# Patient Record
Sex: Male | Born: 1965 | Race: White | Hispanic: No | Smoking: Former smoker
Health system: Southern US, Community
[De-identification: ages and names within clinical notes are randomized; demographics above are authoritative.]

## PROBLEM LIST (undated history)

## (undated) DIAGNOSIS — G459 Transient cerebral ischemic attack, unspecified: Secondary | ICD-10-CM

## (undated) DIAGNOSIS — F1490 Cocaine use, unspecified, uncomplicated: Secondary | ICD-10-CM

## (undated) DIAGNOSIS — F319 Bipolar disorder, unspecified: Secondary | ICD-10-CM

## (undated) DIAGNOSIS — F411 Generalized anxiety disorder: Secondary | ICD-10-CM

## (undated) HISTORY — DX: Cocaine use, unspecified, uncomplicated: F14.90

## (undated) HISTORY — PX: BACK SURGERY: SHX140

## (undated) HISTORY — PX: OTHER SURGICAL HISTORY: SHX169

## (undated) HISTORY — DX: Generalized anxiety disorder: F41.1

## (undated) HISTORY — PX: KNEE SURGERY: SHX244

---

## 1997-09-20 HISTORY — PX: LUMBAR DISC SURGERY: SHX700

## 2005-10-15 ENCOUNTER — Ambulatory Visit: Payer: Self-pay | Admitting: Gastroenterology

## 2006-06-30 ENCOUNTER — Ambulatory Visit: Payer: Self-pay | Admitting: Family Medicine

## 2006-12-21 ENCOUNTER — Other Ambulatory Visit (HOSPITAL_COMMUNITY): Admission: RE | Admit: 2006-12-21 | Discharge: 2007-01-06 | Payer: Self-pay | Admitting: Psychiatry

## 2006-12-23 ENCOUNTER — Ambulatory Visit: Payer: Self-pay | Admitting: Psychiatry

## 2009-09-20 HISTORY — PX: OTHER SURGICAL HISTORY: SHX169

## 2009-11-12 ENCOUNTER — Encounter: Admission: RE | Admit: 2009-11-12 | Discharge: 2009-11-12 | Payer: Self-pay | Admitting: Family Medicine

## 2009-11-14 ENCOUNTER — Ambulatory Visit (HOSPITAL_BASED_OUTPATIENT_CLINIC_OR_DEPARTMENT_OTHER): Admission: RE | Admit: 2009-11-14 | Discharge: 2009-11-14 | Payer: Self-pay | Admitting: Orthopedic Surgery

## 2011-05-22 HISTORY — PX: OTHER SURGICAL HISTORY: SHX169

## 2011-06-01 ENCOUNTER — Ambulatory Visit (HOSPITAL_BASED_OUTPATIENT_CLINIC_OR_DEPARTMENT_OTHER)
Admission: RE | Admit: 2011-06-01 | Discharge: 2011-06-01 | Disposition: A | Discharge status: Home / Self Care | Payer: Worker's Compensation | Source: Ambulatory Visit | Attending: Orthopedic Surgery | Admitting: Orthopedic Surgery

## 2011-06-01 DIAGNOSIS — Z01812 Encounter for preprocedural laboratory examination: Secondary | ICD-10-CM | POA: Insufficient documentation

## 2011-06-01 DIAGNOSIS — M249 Joint derangement, unspecified: Secondary | ICD-10-CM | POA: Insufficient documentation

## 2011-06-08 NOTE — Op Note (Signed)
NAME:  Craig [name], Craig [name] NO.:  [id number]  MEDICAL RECORD NO.:  [id number]  LOCATION:                                 FACILITY:  PHYSICIAN:  Jones Broom, MD         DATE OF BIRTH:  DATE OF PROCEDURE:  06/01/2011 DATE OF DISCHARGE:                              OPERATIVE REPORT   PREOPERATIVE DIAGNOSIS:  Left high-grade distal biceps partial tear.  POSTOPERATIVE DIAGNOSIS:  Left high-grade distal biceps partial tear.  PROCEDURE PERFORMED:  Takedown and reinsertion of high-grade left distal biceps tear.  ATTENDING SURGEON:  Jones Broom, MD  ASSISTANT:  Laural Benes. Su Hilt, [place]  ANESTHESIA:  GETA with preoperative interscalene block.  COMPLICATIONS:  None.  DRAINS:  None.  SPECIMENS:  None.  ESTIMATED BLOOD LOSS:  Minimal.  TOURNIQUET TIME:  None.  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  He had an injury approximately 9 months ago to the distal biceps on the left side. He was diagnosed with a partial tear and has gone through an extensive nonoperative management with activity modification therapy and anti- inflammatories.  He has gone to have continued symptoms despite conservative management and wished to go ahead with surgical management. His MRI showed partial tearing with tendinosis and he was indicated for a takedown and repair of the distal biceps.  He understood the risks, benefits, alternatives of the procedure including but not limited to risk of bleeding, infection, damage to neurovascular structures, risk of non-healing and potential for heterotopic bone formation.  He understood all this and wanted to go ahead with surgery.  OPERATIVE FINDINGS:  The tendon distally was at least 50% torn from the bicipital tuberosity on the proximal radius.  It was in continuity but after exploration I could clearly palpate a significant portion of the tuberosity  which was denuded completely of tendon.  I completed the tear, mobilizing the tendon, and debrided the distal tendon edge of healthy nonviable appearing tendon and subsequently repaired it with a Biomet ZipLoop device and bringing the tendon nicely down into an 8-mm reamed intramedullary hole.  PROCEDURE:  The patient was identified in the preoperative holding area where I personally marked the operative site after verifying site, side and procedure with the patient.  He was taken back to the operating room after an interscalene block was given by the attending anesthesiologist. He was placed supine and general anesthesia was induced.  The left upper extremity was then prepped and draped in a standard sterile fashion with a nonsterile tourniquet applied but not elevated throughout the procedure.  He did receive IV antibiotics prior to the procedure.  After the appropriate time-out procedure, an approximately 5-cm incision was made obliquely from proximal ulnar to distal radial over the bicipital tuberosity on proximal radius.  Dissection was carried down through subcutaneous tissues gently retracting venous structures away until the long head of the biceps tendon was identified.  Laterally, the antebrachial cutaneous nerve was identified just lateral to the tendon and carefully protected throughout the procedure.  One crossing vein had been ligated for exposure and ligated  with 3-0 Vicryl ties.  Using meticulous dissection, the pathway down to the bicipital tuberosity was explored.  The distal biceps tendon was intact although it did not move significantly with pronation and supination of the forearm.  After exposure distally, I was easily able to bring my finger along the bicipital tuberosity and there was a significant portion of it which was bare of tendon.  With gentle traction, I was unable to complete the tear but using Metzenbaum scissors under direct visualization, I was able  to complete the tear.  The proximal rays was exposed carefully using right angle retractors only on the radial aspect to protect the posterior interosseous nerve and carefully debrided of excess tendon and scar tissue.  The tendon was then prepared using two #2 FiberWire type stitches in a locking  Krackow configuration going through the loop device after debriding the unhealthy appearing tendon from the remaining distal biceps tendon.  It was bulletized and felt to be appropriate for an 8-mm tunnel.  The proximal radius was then again exposed and the guide pin was placed bicortically and its position was verified on fluoroscopic imaging to be appropriately positioned.  An 8-mm reamer was then used to ream one cortex and a 4.5-mm reamer was used on the dorsal cortex.  Copious irrigation was used and all bone dust was removed.  The small beef pin was then advanced through the dorsal tissues carefully and a small dorsal incision was made to retrieve the beef pin and pass the leading sutures from the device.  Under direct visualization, the metallic button implant was then passed through both reamed tunnels and slipped on the dorsal cortex.  Its position was verified on fluoroscopy. Then, under direct visualization, the sutures were pulled and the tendon was brought down into the prepared hole with slight flexion of the elbow to allow less tension.  This was done under direct visualization and the tendon was visualized going into the intramedullary area.  The sutures were then cut, tied, and the dorsal suture was removed.  Fluoroscopic imaging verified appropriate positioning of the implant which was right down on the bone dorsally.  The wound was then copiously irrigated with normal saline, subsequently closed in layers with 3-0 Vicryl in deep dermal layer and 4-0 Monocryl for skin closure with Steri-Strips. Sterile dressings were then applied including 4x4s, sterile Webril, and a  well-padded, well molded posterior splint at 90 degrees in neutral position.  The patient was then allowed to awaken from general anesthesia, transferred to stretcher and taken to recovery room in stable condition.  POSTOPERATIVE PLAN:  He will be discharged home in stable condition with his family today.  He will be discharged on Percocet for pain control and Indocin SR for heterotopic prophylaxis.     Jones Broom, MD     JC/MEDQ  D:  06/01/2011  T:  06/01/2011  Job:  [id number]  Electronically Signed by Jones Broom  on 06/08/2011 11:34:37 PM

## 2013-10-23 DIAGNOSIS — R0989 Other specified symptoms and signs involving the circulatory and respiratory systems: Secondary | ICD-10-CM | POA: Insufficient documentation

## 2013-10-23 DIAGNOSIS — R5383 Other fatigue: Secondary | ICD-10-CM | POA: Insufficient documentation

## 2013-10-23 DIAGNOSIS — R531 Weakness: Secondary | ICD-10-CM | POA: Insufficient documentation

## 2013-10-23 DIAGNOSIS — R079 Chest pain, unspecified: Secondary | ICD-10-CM | POA: Insufficient documentation

## 2013-10-25 ENCOUNTER — Encounter: Payer: Self-pay | Admitting: Cardiovascular Disease

## 2013-10-25 ENCOUNTER — Ambulatory Visit (INDEPENDENT_AMBULATORY_CARE_PROVIDER_SITE_OTHER): Payer: 59 | Admitting: Cardiovascular Disease

## 2013-10-25 ENCOUNTER — Encounter (INDEPENDENT_AMBULATORY_CARE_PROVIDER_SITE_OTHER): Payer: Self-pay

## 2013-10-25 ENCOUNTER — Telehealth: Payer: Self-pay | Admitting: *Deleted

## 2013-10-25 ENCOUNTER — Encounter (HOSPITAL_COMMUNITY): Payer: Self-pay

## 2013-10-25 VITALS — BP 106/80 | HR 82 | Ht 71.0 in | Wt 175.0 lb

## 2013-10-25 DIAGNOSIS — R5383 Other fatigue: Secondary | ICD-10-CM

## 2013-10-25 DIAGNOSIS — R079 Chest pain, unspecified: Secondary | ICD-10-CM

## 2013-10-25 DIAGNOSIS — R531 Weakness: Secondary | ICD-10-CM

## 2013-10-25 DIAGNOSIS — R5381 Other malaise: Secondary | ICD-10-CM

## 2013-10-25 MED ORDER — ASPIRIN EC 81 MG PO TBEC: 81.0000 mg | DELAYED_RELEASE_TABLET | Freq: Every day | ORAL | Status: DC

## 2013-10-25 NOTE — Telephone Encounter (Signed)
Message copied by Jonathon Jordan on Thu Oct 25, 2013  4:00 PM ------      Message from: Thayer Headings      Created: Thu Oct 25, 2013  3:57 PM      Regarding: RE: Exercise Myoview       We will schedule him for a GXT and an echo.      i have copied Jodette on this message.              ----- Message -----         From: Lillia Pauls         Sent: 10/25/2013   1:20 PM           To: Thayer Headings, MD      Subject: Exercise Myoview                                         Dr. Acie Fredrickson,            Case went into clinical review.  Test is Monday.  If not approved do you want to do MD review?  Or do 2D echo and GXT?              They did ask if pt can walk on treadmill.  (RBBB)            Thanks            Charmaine       ------

## 2013-10-25 NOTE — Telephone Encounter (Signed)
PT WAS INFORMED/ NEW ORDERS PLACED.

## 2013-10-25 NOTE — Patient Instructions (Signed)
Your physician has requested that you have en exercise stress Myoview ASAP PLEASE. For further information please visit [email or web address]. Please follow instruction sheet, as given.   Your physician recommends that you schedule a follow-up appointment in: 2-3 months    Your physician has recommended you make the following change in your medication:  START ASPIRIN 81 MG EACH EVENING, USE ENTERIC COATED ASPIRIN

## 2013-10-25 NOTE — Progress Notes (Signed)
Craig [name] Date of Birth  [birth year]       [facility]    [employer] [address] [place], [place], [place]  [address]   [place], [place]  [address] [address]   Fax  [phone]    Fax [phone]  Problem List: 1. Chest pain   History of Present Illness:  Craig [name] is a 48 yo who is referred for further eval of some chest pain.  Several weeks ago, Craig [name] had some SSCP ( just after lunch).    The pain lasted 5 minutes.  Craig [name] took an ASA.  Craig [name] felt poorly the rest of the day and for several days following that.   Craig [name] was fatigued for several days .  Craig [name] did not return to work for several days.   Craig [name] slept quite a bit for 2 days following that.    STress myoview 01/05/12 was negative for ischemia.  Craig [name] has had some chest pressure - at rest (while driving).    Craig [name] does not get any regular exercise.  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    Craig [name] smokes 1 ppd.   Craig [name] used cocaine for 21 years - none in 11 years.   Craig [name] has lost 30 lbs over the past 6 months.  Grandfather had CAD Father had CAD in Craig [name] 81's.  Current Outpatient Prescriptions on File Prior to Visit  Medication Sig Dispense Refill  . ALPRAZolam (XANAX) 1 MG tablet Take 1 mg by mouth at bedtime as needed for anxiety.      Marland Kitchen zolpidem (AMBIEN CR) 12.5 MG CR tablet Take 12.5 mg by mouth at bedtime as needed for sleep.       No current facility-administered medications on file prior to visit.    Allergies  Allergen Reactions  . Celexa [Citalopram Hydrobromide]   . Effexor [Venlafaxine]   . Lexapro [Escitalopram Oxalate]     AGITATED   . Paxil [Paroxetine Hcl]     AGITATED   . Zoloft [Sertraline Hcl]     AGITATED    Past Medical History  Diagnosis Date  . GAD (generalized anxiety disorder)   . Cocaine use     QUIT IN 2004    Past Surgical History  Procedure Laterality Date  . Lumbar disc surgery  1999  . Right hand fracture  2011  . Left  biceps tear  05/2011    History  Smoking status  . Current Every Day Smoker  Smokeless tobacco  . Not on file    History  Alcohol Use: Not on file    Family History  Problem Relation Age of Onset  . CAD Father     Reviw of Systems:  Reviewed in the HPI.  All other systems are negative.  Physical Exam: Blood pressure 106/80, pulse 82, height 5\' 11"  (1.803 m), weight 175 lb (79.379 kg). Wt Readings from Last 3 Encounters:  10/25/13 175 lb (79.379 kg)     General: Well developed, well nourished, in no acute distress.  Head: Normocephalic, atraumatic, sclera non-icteric, mucus membranes are moist,   Neck: Supple. Carotids are 2 + without bruits. No JVD   Lungs: Clear   Heart: RR, normal S1, S2.   Abdomen: Soft, non-tender, non-distended with normal bowel sounds.  Msk:  Strength and tone are normal   Extremities: No clubbing or cyanosis. No edema.  Distal pedal pulses are 2+ and equal  Neuro: CN II - XII intact.  Alert and oriented X 3.   Psych:  Normal   ECG: Feb. 5, 2015:  NSR at 82, inc. RBBB.  No ST or T wave changes  Assessment / Plan:

## 2013-10-25 NOTE — Assessment & Plan Note (Addendum)
He presents today for further evaluation of some chest pain. He said several episodes of chest discomfort that occurred at rest. He's not had any exertional chest discomfort but he doesn't get any regular aerobic exercise. He has a family history of premature coronary artery disease and also has a long history of cigarette smoking.  We'll schedule him for a stress Myoview study next week. I strongly recommended that he stop smoking. We'll start him on an aspirin 81 mg a day.  He reported that he's lost 30 pounds over the past 6 months and has not been on a diet. He did discuss this with Dr. Alroy Dust. This certainly will need further evaluation.  His dad is relatively unhealthy. He eats a moderate amount fast food and does not eat any vegetables.   I have encouraged him to improve his diet.   I will see him in 2-3 months - sooner if his myoview is positive.  Addendum:  His insurance company did not approve the stress myoview.  We will do a GXT and an echo.  I will see him back for follow up.

## 2013-10-26 ENCOUNTER — Telehealth: Payer: Self-pay | Admitting: Cardiovascular Disease

## 2013-10-26 ENCOUNTER — Telehealth: Payer: Self-pay | Admitting: *Deleted

## 2013-10-26 NOTE — Telephone Encounter (Signed)
I was contacted by Charmaine who relayed the info that insurance would not permit..  She may know from experience that they would not authorize but if the insurance will auth, lets do the stress nuclear

## 2013-10-26 NOTE — Telephone Encounter (Signed)
Pt states he called his insurance yesterday and they stated that we never contacted them or submitted any testing requests from our office. I spoke with Charmaine Hall/ billing, request is on pending status still // orders and OV was printed and given to Nurse manager Emeline Darling RN. She will review and call the pt back.

## 2013-10-26 NOTE — Telephone Encounter (Signed)
New problem   Pt need to speak to nurse concerning his Echo procedure being cancelled. Please call pt

## 2013-10-26 NOTE — Telephone Encounter (Signed)
I spoke with Craig [name] on yesterday to schedule his echo and gxt both were schedule for 2/11 and 11/01/13,patient request they both be schedule on the same  day I inform him this could push the test out until the end of the month or the first of the next month and he was okay with this.I call Craig [name] today and he said his  Insurance company was not notified and would like for [place] to resubmit the test again.Patient refused,test cancelled.Waiting to here back from [employer].

## 2013-10-29 ENCOUNTER — Encounter (HOSPITAL_COMMUNITY): Payer: Self-pay

## 2013-10-31 ENCOUNTER — Encounter (HOSPITAL_COMMUNITY): Payer: Self-pay

## 2013-10-31 ENCOUNTER — Ambulatory Visit (HOSPITAL_COMMUNITY)
Admission: RE | Admit: 2013-10-31 | Discharge: 2013-10-31 | Disposition: A | Discharge status: Home / Self Care | Payer: 59 | Source: Ambulatory Visit | Attending: Cardiovascular Disease | Admitting: Cardiovascular Disease

## 2013-10-31 DIAGNOSIS — R079 Chest pain, unspecified: Secondary | ICD-10-CM | POA: Insufficient documentation

## 2013-10-31 MED ORDER — TECHNETIUM TC 99M SESTAMIBI GENERIC - CARDIOLITE
30.0000 | Freq: Once | INTRAVENOUS | Status: AC | PRN
  Administered 2013-10-31: 30 via INTRAVENOUS

## 2013-11-01 ENCOUNTER — Ambulatory Visit (HOSPITAL_COMMUNITY): Payer: Self-pay

## 2013-11-06 ENCOUNTER — Ambulatory Visit (HOSPITAL_COMMUNITY): Payer: 59

## 2013-11-14 ENCOUNTER — Ambulatory Visit (HOSPITAL_COMMUNITY): Payer: 59

## 2013-11-20 ENCOUNTER — Ambulatory Visit (HOSPITAL_COMMUNITY)
Admission: RE | Admit: 2013-11-20 | Discharge: 2013-11-20 | Disposition: A | Discharge status: Home / Self Care | Payer: 59 | Source: Ambulatory Visit | Attending: Cardiovascular Disease | Admitting: Cardiovascular Disease

## 2013-11-20 DIAGNOSIS — I369 Nonrheumatic tricuspid valve disorder, unspecified: Secondary | ICD-10-CM

## 2013-11-20 DIAGNOSIS — R079 Chest pain, unspecified: Secondary | ICD-10-CM | POA: Insufficient documentation

## 2013-11-20 NOTE — Progress Notes (Signed)
2D Echo Performed 11/20/2013    Taquila Leys, RCS  

## 2014-02-14 ENCOUNTER — Ambulatory Visit: Payer: 59 | Admitting: Cardiovascular Disease

## 2014-06-10 ENCOUNTER — Ambulatory Visit (HOSPITAL_COMMUNITY)
Admission: RE | Admit: 2014-06-10 | Discharge: 2014-06-10 | Disposition: A | Discharge status: Home / Self Care | Payer: 59 | Source: Ambulatory Visit | Attending: Orthopedic Surgery | Admitting: Orthopedic Surgery

## 2014-06-10 ENCOUNTER — Other Ambulatory Visit (HOSPITAL_COMMUNITY): Payer: Self-pay | Admitting: Orthopedic Surgery

## 2014-06-10 DIAGNOSIS — M79605 Pain in left leg: Secondary | ICD-10-CM

## 2014-06-10 DIAGNOSIS — M79609 Pain in unspecified limb: Secondary | ICD-10-CM | POA: Insufficient documentation

## 2014-06-10 DIAGNOSIS — M7989 Other specified soft tissue disorders: Secondary | ICD-10-CM | POA: Insufficient documentation

## 2014-06-10 NOTE — Progress Notes (Signed)
*  PRELIMINARY RESULTS* Vascular Ultrasound Left lower extremity venous duplex has been completed.  Preliminary findings: No evidence of DVT.  Called results to Angie at Dr. Bettina Gavia office. A PA will contact patient with further instructions.    Landry Mellow, RDMS, RVT  06/10/2014, 4:41 PM

## 2014-07-16 ENCOUNTER — Encounter (HOSPITAL_COMMUNITY): Payer: Self-pay | Admitting: Emergency Medicine

## 2014-07-16 ENCOUNTER — Emergency Department (HOSPITAL_COMMUNITY)
Admission: EM | Admit: 2014-07-16 | Discharge: 2014-07-16 | Disposition: A | Discharge status: Home / Self Care | Payer: 59 | Source: Home / Self Care | Attending: Family Medicine | Admitting: Family Medicine

## 2014-07-16 DIAGNOSIS — S61319A Laceration without foreign body of unspecified finger with damage to nail, initial encounter: Secondary | ICD-10-CM

## 2014-07-16 MED ORDER — TETANUS-DIPHTH-ACELL PERTUSSIS 5-2.5-18.5 LF-MCG/0.5 IM SUSP
0.5000 mL | Freq: Once | INTRAMUSCULAR | Status: AC
  Administered 2014-07-16: 0.5 mL via INTRAMUSCULAR

## 2014-07-16 MED ORDER — TETANUS-DIPHTH-ACELL PERTUSSIS 5-2.5-18.5 LF-MCG/0.5 IM SUSP
INTRAMUSCULAR | Status: AC
  Filled 2014-07-16: qty 0.5

## 2014-07-16 NOTE — Discharge Instructions (Signed)
Leave bandaged until fri then cover and clean as discussed, you had a tetanus booster. Return as needed.

## 2014-07-16 NOTE — ED Notes (Signed)
Patient c/o left index finger laceration onset this morning. Patient reports he was cutting wallpaper and cut his finger with a razor blade. Patient is unsure of when last tetanus shot was. Patient is in NAD.

## 2014-07-16 NOTE — ED Provider Notes (Signed)
CSN: [id number]     Arrival date & time 07/16/14  1634 History   First MD Initiated Contact with Patient 07/16/14 1700     Chief Complaint  Patient presents with  . Extremity Laceration   (Consider location/radiation/quality/duration/timing/severity/associated sxs/prior Treatment) Patient is a 48 y.o. male presenting with skin laceration.  Laceration Location:  Finger Finger laceration location:  L index finger Depth:  Cutaneous Quality: avulsion   Bleeding: controlled   Time since incident:  6 hours Laceration mechanism:  Knife (cutting wallpaper with new razor blade and cut fingertip.) Pain details:    Severity:  Mild   Progression:  Unchanged Foreign body present:  No foreign bodies Worsened by:  Nothing tried Ineffective treatments:  None tried Tetanus status:  Out of date   Past Medical History  Diagnosis Date  . GAD (generalized anxiety disorder)   . Cocaine use     QUIT IN 2004   Past Surgical History  Procedure Laterality Date  . Lumbar disc surgery  1999  . Right hand fracture  2011  . Left biceps tear  05/2011   Family History  Problem Relation Age of Onset  . CAD Father    History  Substance Use Topics  . Smoking status: Current Every Day Smoker  . Smokeless tobacco: Not on file  . Alcohol Use: Not on file    Review of Systems  Constitutional: Negative.   Skin: Positive for wound.    Allergies  Celexa; Effexor; Lexapro; Paxil; and Zoloft  Home Medications   Prior to Admission medications   Medication Sig Start Date End Date Taking? Authorizing Provider  ALPRAZolam Duanne Moron) 1 MG tablet Take 1 mg by mouth at bedtime as needed for anxiety.    Historical Provider, MD  aspirin EC 81 MG tablet Take 1 tablet (81 mg total) by mouth daily. 10/25/13   Thayer Headings, MD  zolpidem (AMBIEN CR) 12.5 MG CR tablet Take 12.5 mg by mouth at bedtime as needed for sleep.    Historical Provider, MD   BP 143/97  Pulse 62  Temp(Src) 98.3 F (36.8 C) (Oral)   Resp 18  SpO2 98% Physical Exam  Nursing note and vitals reviewed. Constitutional: He is oriented to person, place, and time. He appears well-developed and well-nourished.  Musculoskeletal:       Hands: Neurological: He is alert and oriented to person, place, and time.  Skin: Skin is warm and dry.    ED Course  Procedures (including critical care time) Labs Review Labs Reviewed - No data to display  Imaging Review No results found.   MDM   1. Laceration of finger nail bed, initial encounter    Flap debrided, betadine soaked, xeroform dsd.    Billy Fischer, MD 07/16/14 1726

## 2016-09-27 ENCOUNTER — Other Ambulatory Visit: Payer: Self-pay | Admitting: Family Medicine

## 2016-09-27 ENCOUNTER — Ambulatory Visit
Admission: RE | Admit: 2016-09-27 | Discharge: 2016-09-27 | Disposition: A | Discharge status: Home / Self Care | Payer: Self-pay | Source: Ambulatory Visit | Attending: Family Medicine | Admitting: Family Medicine

## 2016-09-27 DIAGNOSIS — R1310 Dysphagia, unspecified: Secondary | ICD-10-CM

## 2016-09-27 DIAGNOSIS — R1319 Other dysphagia: Secondary | ICD-10-CM

## 2016-09-30 ENCOUNTER — Other Ambulatory Visit: Payer: Self-pay

## 2016-10-06 ENCOUNTER — Other Ambulatory Visit: Payer: Self-pay

## 2016-10-11 ENCOUNTER — Ambulatory Visit
Admission: RE | Admit: 2016-10-11 | Discharge: 2016-10-11 | Disposition: A | Discharge status: Home / Self Care | Payer: 59 | Source: Ambulatory Visit | Attending: Family Medicine | Admitting: Family Medicine

## 2016-10-11 ENCOUNTER — Other Ambulatory Visit: Payer: Self-pay

## 2016-10-11 DIAGNOSIS — R1319 Other dysphagia: Secondary | ICD-10-CM

## 2016-10-11 DIAGNOSIS — R1310 Dysphagia, unspecified: Secondary | ICD-10-CM

## 2018-05-04 ENCOUNTER — Emergency Department (HOSPITAL_COMMUNITY): Payer: Managed Care, Other (non HMO)

## 2018-05-04 ENCOUNTER — Emergency Department (HOSPITAL_COMMUNITY)
Admission: EM | Admit: 2018-05-04 | Discharge: 2018-05-04 | Disposition: A | Discharge status: Home / Self Care | Payer: Managed Care, Other (non HMO) | Attending: Emergency Medicine | Admitting: Emergency Medicine

## 2018-05-04 ENCOUNTER — Other Ambulatory Visit: Payer: Self-pay

## 2018-05-04 ENCOUNTER — Encounter (HOSPITAL_COMMUNITY): Payer: Self-pay | Admitting: Emergency Medicine

## 2018-05-04 DIAGNOSIS — R079 Chest pain, unspecified: Secondary | ICD-10-CM | POA: Diagnosis not present

## 2018-05-04 DIAGNOSIS — R9431 Abnormal electrocardiogram [ECG] [EKG]: Secondary | ICD-10-CM | POA: Insufficient documentation

## 2018-05-04 DIAGNOSIS — Z87891 Personal history of nicotine dependence: Secondary | ICD-10-CM | POA: Diagnosis not present

## 2018-05-04 DIAGNOSIS — R0602 Shortness of breath: Secondary | ICD-10-CM | POA: Diagnosis present

## 2018-05-04 HISTORY — DX: Bipolar disorder, unspecified: F31.9

## 2018-05-04 LAB — BASIC METABOLIC PANEL
ANION GAP: 7 (ref 5–15)
BUN: 18 mg/dL (ref 6–20)
CALCIUM: 9.2 mg/dL (ref 8.9–10.3)
CO2: 26 mmol/L (ref 22–32)
Chloride: 104 mmol/L (ref 98–111)
Creatinine, Ser: 1.11 mg/dL (ref 0.61–1.24)
GFR calc Af Amer: >60 mL/min (ref >60)
Glucose, Bld: 97 mg/dL (ref 70–99)
POTASSIUM: 4.1 mmol/L (ref 3.5–5.1)
SODIUM: 137 mmol/L (ref 135–145)

## 2018-05-04 LAB — CBC
HEMATOCRIT: 54.7 % — AB (ref 39.0–52.0)
HEMOGLOBIN: 18.2 g/dL — AB (ref 13.0–17.0)
MCH: 31.6 pg (ref 26.0–34.0)
MCHC: 33.3 g/dL (ref 30.0–36.0)
MCV: 95 fL (ref 78.0–100.0)
Platelets: 238 10*3/uL (ref 150–400)
RBC: 5.76 MIL/uL (ref 4.22–5.81)
RDW: 12 % (ref 11.5–15.5)
WBC: 7.7 10*3/uL (ref 4.0–10.5)

## 2018-05-04 LAB — RAPID URINE DRUG SCREEN, HOSP PERFORMED
Amphetamines: NOT DETECTED
BARBITURATES: NOT DETECTED
Benzodiazepines: POSITIVE — AB
COCAINE: NOT DETECTED
Opiates: NOT DETECTED
Tetrahydrocannabinol: POSITIVE — AB

## 2018-05-04 LAB — I-STAT TROPONIN, ED
TROPONIN I, POC: 0 ng/mL (ref 0.00–0.08)
TROPONIN I, POC: 0 ng/mL (ref 0.00–0.08)

## 2018-05-04 LAB — D-DIMER, QUANTITATIVE: D-Dimer, Quant: 0.33 ug/mL-FEU (ref 0.00–0.50)

## 2018-05-04 MED ORDER — SODIUM CHLORIDE 0.9 % IV BOLUS
500.0000 mL | Freq: Once | INTRAVENOUS | Status: AC
  Administered 2018-05-04: 500 mL via INTRAVENOUS

## 2018-05-04 MED ORDER — SODIUM CHLORIDE 0.9 % IV BOLUS
1000.0000 mL | Freq: Once | INTRAVENOUS | Status: AC
  Administered 2018-05-04: 1000 mL via INTRAVENOUS

## 2018-05-04 NOTE — ED Notes (Addendum)
Pt ambulated around the hall with no problem.

## 2018-05-04 NOTE — ED Notes (Signed)
PT states understanding of care given, follow up care, and medication prescribed. PT ambulated from ED to car with a steady gait. 

## 2018-05-04 NOTE — ED Triage Notes (Signed)
Pt states today he feels very tired

## 2018-05-04 NOTE — ED Triage Notes (Signed)
Pt was mowing the yard at his moms house and came inside because he didn't feel good. States he laid in the floor and was "gasping for air and having chest pain" for 1 hour. Pt denies chest pain today. Refused to come to ED yesterday and went home and rested. Pt saw DR today and PCP noted T wave changes and told him to come here. Pt anxious during triage.

## 2018-05-04 NOTE — Discharge Instructions (Addendum)
Your work-up has been reassuring today.  Unknown cause of your symptoms.  No signs of a heart attack or blood clot today.  I would have close follow-up with your primary care doctor and a cardiologist.  Return immediately to the ED if you develop further chest pain or shortness of breath.

## 2018-05-04 NOTE — ED Provider Notes (Signed)
[place] EMERGENCY DEPARTMENT Provider Note   CSN: [id number] Arrival date & time: 05/04/18  1612     History   Chief Complaint Chief Complaint  Patient presents with  . Abnormal ECG    HPI Craig [name] is a 52 y.o. male.  HPI 52 year old Caucasian male past medical history significant for bipolar disorder, anxiety disorder, prior cocaine abuse presents to the emergency department today for evaluation after referral from primary care.  The patient states that he was working in the yard yesterday afternoon when he became very hot and flushed.  He states that he got overheated and went inside.  When he got inside he complained of shortness of breath and having to gasp for air along with chest pain.  Patient states this lasted for approximately 1 hours and self resolved after he got Gatorade to drink and rested some.  The patient states that the chest pain was pressure-like in nature that was very minimal.  States that it was not radiating.  Does report associated diaphoresis with episode however patient was working in the yard in the heat.  Patient denies any chest pain or shortness of breath.  Does report feeling tired today.  States that he went to his primary care doctor's today where he had an EKG performed that showed some T wave changes and sent patient to the ED for possible heart attack.  Patient reports that he was seen by cardiology in 2015 for chest pain with a normal stress test at that time.  Patient states he has history of cocaine use but has not used cocaine 11 years.  Denies any history of PE/DVT, prolonged immobilization, recent hospitalization/surgeries, unilateral leg swelling or calf tenderness, tobacco use.  Pt denies any fever, chill, ha, vision changes, lightheadedness, dizziness, congestion, neck pain,cough, abd pain, n/v/d, urinary symptoms, change in bowel habits, melena, hematochezia, lower extremity paresthesias.  Past Medical History:    Diagnosis Date  . Bipolar affective disorder ([place])   . Cocaine use    QUIT IN 2004  . GAD (generalized anxiety disorder)     Patient Active Problem List   Diagnosis Date Noted  . Fatigue 10/23/2013  . Weakness 10/23/2013  . Chest pain 10/23/2013  . Chest congestion 10/23/2013    Past Surgical History:  Procedure Laterality Date  . LEFT BICEPS TEAR  05/2011  . [place] SURGERY  1999  . RIGHT HAND FRACTURE  2011        Home Medications    Prior to Admission medications   Medication Sig Start Date End Date Taking? Authorizing Provider  ALPRAZolam Duanne Moron) 1 MG tablet Take 1 mg by mouth at bedtime as needed for anxiety.    [provider]  aspirin EC 81 MG tablet Take 1 tablet (81 mg total) by mouth daily. 10/25/13   Nahser, Wonda Cheng, MD  zolpidem (AMBIEN CR) 12.5 MG CR tablet Take 12.5 mg by mouth at bedtime as needed for sleep.    [provider]    Family History Family History  Problem Relation Age of Onset  . CAD Father     Social History Social History   Tobacco Use  . Smoking status: Former Research scientist (life sciences)  . Smokeless tobacco: Never Used  Substance Use Topics  . Alcohol use: Never    Frequency: Never  . Drug use: Yes    Types: Cocaine     Allergies   Celexa [citalopram hydrobromide]; Effexor [venlafaxine]; Lexapro [escitalopram oxalate]; Paxil [paroxetine hcl]; and  Zoloft [sertraline hcl]   Review of Systems Review of Systems  All other systems reviewed and are negative.    Physical Exam Updated Vital Signs BP 127/78   Pulse 70   Temp 97.9 F (36.6 C) (Oral)   Resp 20   Ht 5' 10.5" (1.791 m)   Wt 96.6 kg   SpO2 100%   BMI 30.13 kg/m   Physical Exam  Constitutional: He is oriented to person, place, and time. He appears well-developed and well-nourished.  Non-toxic appearance. No distress.  HENT:  Head: Normocephalic and atraumatic.  Nose: Nose normal.  Mouth/Throat: Oropharynx is clear and moist.  Eyes: Pupils are equal,  round, and reactive to light. Conjunctivae are normal. Right eye exhibits no discharge. Left eye exhibits no discharge.  Neck: Normal range of motion. Neck supple. No JVD present. No tracheal deviation present.  Cardiovascular: Normal rate, regular rhythm, normal heart sounds and intact distal pulses. Exam reveals no gallop and no friction rub.  No murmur heard. Pulmonary/Chest: Effort normal and breath sounds normal. No stridor. No respiratory distress. He has no wheezes. He has no rales. He exhibits no tenderness.  No hypoxia or tachypnea.  Abdominal: Soft. Bowel sounds are normal. He exhibits no distension. There is no tenderness. There is no rebound and no guarding.  Musculoskeletal: Normal range of motion.  No lower extremity edema or calf tenderness.  Lymphadenopathy:    He has no cervical adenopathy.  Neurological: He is alert and oriented to person, place, and time.  The patient is alert, attentive, and oriented x 3. Speech is clear. Cranial nerve II-VII grossly intact. Negative pronator drift. Sensation intact. Strength 5/5 in all extremities. Reflexes 2+ and symmetric at biceps, triceps, knees, and ankles. Rapid alternating movement and fine finger movements intact. Romberg is absent. Posture and gait normal.   Skin: Skin is warm and dry. Capillary refill takes less than 2 seconds. He is not diaphoretic.  Psychiatric: His behavior is normal. Judgment and thought content normal.  Nursing note and vitals reviewed.    ED Treatments / Results  Labs (all labs ordered are listed, but only abnormal results are displayed) Labs Reviewed  CBC - Abnormal; Notable for the following components:      Result Value   Hemoglobin 18.2 (*)    HCT 54.7 (*)    All other components within normal limits  RAPID URINE DRUG SCREEN, HOSP PERFORMED - Abnormal; Notable for the following components:   Benzodiazepines POSITIVE (*)    Tetrahydrocannabinol POSITIVE (*)    All other components within normal  limits  BASIC METABOLIC PANEL  D-DIMER, QUANTITATIVE (NOT AT [facility])  I-STAT TROPONIN, ED  I-STAT TROPONIN, ED    EKG EKG Interpretation  Date/Time:  Thursday May 04 2018 21:51:12 EDT Ventricular Rate:  71 PR Interval:  144 QRS Duration: 111 QT Interval:  362 QTC Calculation: 394 R Axis:   -62 Text Interpretation:  Sinus rhythm LAD, consider left anterior fascicular block RSR' in V1 or V2, right VCD or RVH ST elev, probable normal early repol pattern no significant change since earlier in the day Confirmed by Sherwood Gambler [phone]) on 05/04/2018 10:17:44 PM   Radiology Dg Chest 2 View  Result Date: 05/04/2018 CLINICAL DATA:  52 y/o M; chest pressure and shortness of breath for 2 days. History of cocaine use. EXAM: CHEST - 2 VIEW COMPARISON:  09/27/2016 chest radiograph FINDINGS: Stable heart size and mediastinal contours are within normal limits. Both lungs are clear. The visualized skeletal structures  are unremarkable. IMPRESSION: No active cardiopulmonary disease. Electronically Signed   By: Kristine Garbe M.D.   On: 05/04/2018 17:25    Procedures Procedures (including critical care time)  Medications Ordered in ED Medications  sodium chloride 0.9 % bolus 1,000 mL (0 mLs Intravenous Stopped 05/04/18 2332)  sodium chloride 0.9 % bolus 500 mL (0 mLs Intravenous Stopped 05/04/18 2332)     Initial Impression / Assessment and Plan / ED Course  I have reviewed the triage vital signs and the nursing notes.  Pertinent labs & imaging results that were available during my care of the patient were reviewed by me and considered in my medical decision making (see chart for details).     Patient presents to the ED for evaluation after primary care sent him here for possible T wave changes on EKG.  Reports that he was working in the yard yesterday when he got overheated and had some chest pain or shortness of breath.  Has had no further episodes of chest pain or shortness of  breath.  Patient denies any PE risk factors.  Denies any significant cardiac history.  Patient's vital signs have been reassuring in the ED.  He is afebrile.  No focal neuro deficit on exam.  Heart regular rate and rhythm.  Lungs clear to auscultation bilaterally.  Neurovascular intact all extremities.  Work-up reassuring.  No leukocytosis.  Patient seems mildly hemoconcentrated with hemoglobin of 18.  No significant electrolyte derangement.  Negative delta troponin.  Normal d-dimer.  EKG with some nonspecific changes that appear similar to prior tracing but no acute findings of ischemia.  Chest x-ray has been reassuring.  Patient given IV fluids in the ED.  Continues to have no chest pain or shortness of breath.  Heart pathway score is 3.  Have low suspicion of ACS, PE or dissection for patient.  Focal neuro deficit.  Low suspicion for CVA.  I did speak with cardiology concerning patient.  They felt that patient's lab work and story sounds reassuring that he can follow-up in outpatient setting.  I also discussed the patient with my attending Dr. Francia Greaves who is agreed with the above plan.  No indication for further emergent work-up or admission at this time.  I suspect that patient's symptoms yesterday are likely from dehydration.  Patient's EKG appears similar to prior tracings today.  Will have close outpatient cardiology and primary care follow-up.  Pt is hemodynamically stable, in NAD, & able to ambulate in the ED. Evaluation does not show pathology that would require ongoing emergent intervention or inpatient treatment. I explained the diagnosis to the patient. Pain has been managed & has no complaints prior to dc. Pt is comfortable with above plan and is stable for discharge at this time. All questions were answered prior to disposition. Strict return precautions for f/u to the ED were discussed. Encouraged follow up with PCP.   Final Clinical Impressions(s) / ED Diagnoses   Final diagnoses:  Chest  pain, unspecified type  SOB (shortness of breath)    ED Discharge Orders    None       Aaron Edelman 05/05/18 0342    Valarie Merino, MD 05/06/18 [phone]

## 2018-05-17 ENCOUNTER — Encounter: Payer: Self-pay | Admitting: Cardiovascular Disease

## 2018-05-17 ENCOUNTER — Ambulatory Visit: Payer: Managed Care, Other (non HMO) | Admitting: Cardiovascular Disease

## 2018-05-17 ENCOUNTER — Encounter: Payer: Self-pay | Admitting: *Deleted

## 2018-05-17 VITALS — BP 118/80 | HR 85 | Ht 70.5 in | Wt 218.0 lb

## 2018-05-17 DIAGNOSIS — Z13220 Encounter for screening for lipoid disorders: Secondary | ICD-10-CM

## 2018-05-17 DIAGNOSIS — R072 Precordial pain: Secondary | ICD-10-CM | POA: Diagnosis not present

## 2018-05-17 DIAGNOSIS — E7800 Pure hypercholesterolemia, unspecified: Secondary | ICD-10-CM | POA: Diagnosis not present

## 2018-05-17 DIAGNOSIS — Z01812 Encounter for preprocedural laboratory examination: Secondary | ICD-10-CM | POA: Diagnosis not present

## 2018-05-17 MED ORDER — METOPROLOL TARTRATE 50 MG PO TABS: ORAL_TABLET | ORAL | 0 refills | Status: DC

## 2018-05-17 NOTE — Progress Notes (Signed)
Cardiology Office Note   Date:  05/17/2018   ID:  Craig, [name] 05/02/1966, MRN [id number]  PCP:  Gaynelle Arabian, MD  Cardiologist:   Skeet Latch, MD   Chief Complaint  Patient presents with  . Follow-up    ABN EKG.      History of Present Illness: Craig [name] is a 52 y.o. male with bipolar disorder, anxiety who is being seen today for the evaluation of abnromal EKG and chest pain at the request of Alroy Dust, L.Marlou Sa, MD. [name] was mowing a lawn in hot weather 05/03/18 with he became drenched in sweat and near syncopal.   He was trying to keep up with his fluid intake but decided to go to his mother's house to rest.  He laid down on the floor and was unable to catch his breath for nearly an hour.  He felt like there is a small dog sitting on his chest.  His family encouraged him to call EMS but he refused.  The next day decided to go to the primary care office.  They did an EKG that reportedly showed some T wave abnormalities and he was referred to the emergency department.  In the ED his EKG was normal.  Cardiac enzymes were negative.  It was felt that he had intravascular volume depletion and he received IV fluids.  He was encouraged to follow-up with cardiology as an outpatient.  The following night he had some mild chest pain and tingling in his left hand that occurred while sitting on the couch.  He has not had much physical activity since that time.  He has not had any recurrent chest pain or shortness of breath when walking up stairs or carrying groceries.  His breathing has been okay and he denies any lower extremity edema, orthopnea, or PND.  In 2015 [name] had intermittent episodes of chest pain.  He had a stress test that was reportedly normal.  He started taking baby aspirin and has not had any recurrent chest pain since his most recent episode.  He reports that he recently had his cholesterol checked at a wellness program and that they were good.  He is  unsure the exact numbers.  Smoking in 2004.  Quit using cocaine 15 years ago.  He did have intermittent episodes of chest pain.   Past Medical History:  Diagnosis Date  . Bipolar affective disorder ([place])   . Cocaine use    QUIT IN 2004  . GAD (generalized anxiety disorder)     Past Surgical History:  Procedure Laterality Date  . LEFT BICEPS TEAR  05/2011  . [place] SURGERY  1999  . RIGHT HAND FRACTURE  2011     Current Outpatient Medications  Medication Sig Dispense Refill  . ALPRAZolam (XANAX) 1 MG tablet Take 1 mg by mouth at bedtime as needed for anxiety.    Marland Kitchen aspirin EC 81 MG tablet Take 1 tablet (81 mg total) by mouth daily. 90 tablet 3  . cyclobenzaprine (FLEXERIL) 5 MG tablet Take 5 mg by mouth daily.  0  . zolpidem (AMBIEN CR) 12.5 MG CR tablet Take 12.5 mg by mouth at bedtime as needed for sleep.    . metoprolol tartrate (LOPRESSOR) 50 MG tablet 1 TABLET 1 HOUR PRIOR TO CT 1 tablet 0   No current facility-administered medications for this visit.     Allergies:   Celexa [citalopram hydrobromide]; Effexor [venlafaxine]; Lexapro [escitalopram oxalate]; Paxil [paroxetine hcl]; and  Zoloft [sertraline hcl]    Social History:  The patient  reports that he has quit smoking. He has never used smokeless tobacco. He reports that he has current or past drug history. Drug: Cocaine. He reports that he does not drink alcohol.   Family History:  The patient's family history includes CAD in his father; Heart attack in his mother; Heart disease in his maternal grandfather; Heart failure in his maternal grandfather; Lupus in his mother; Stroke in his mother.    ROS:  Please see the history of present illness.   Otherwise, review of systems are positive for none.   All other systems are reviewed and negative.    PHYSICAL EXAM: VS:  BP 118/80 (BP Location: Left Arm, Patient Position: Sitting, Cuff Size: Normal)   Pulse 85   Ht 5' 10.5" (1.791 m)   Wt 218 lb (98.9 kg)   BMI 30.84  kg/m  , BMI Body mass index is 30.84 kg/m. GENERAL:  Well appearing HEENT:  Pupils equal round and reactive, fundi not visualized, oral mucosa unremarkable NECK:  No jugular venous distention, waveform within normal limits, carotid upstroke brisk and symmetric, no bruits LUNGS:  Clear to auscultation bilaterally HEART:  RRR.  PMI not displaced or sustained,S1 and S2 within normal limits, no S3, no S4, no clicks, no rubs, no murmurs ABD:  Flat, positive bowel sounds normal in frequency in pitch, no bruits, no rebound, no guarding, no midline pulsatile mass, no hepatomegaly, no splenomegaly EXT:  2 plus pulses throughout, no edema, no cyanosis no clubbing SKIN:  No rashes no nodules NEURO:  Cranial nerves II through XII grossly intact, motor grossly intact throughout PSYCH:  Cognitively intact, oriented to person place and time   EKG:  EKG is not ordered today.   Recent Labs: 05/04/2018: BUN 18; Creatinine, Ser 1.11; Hemoglobin 18.2; Platelets 238; Potassium 4.1; Sodium 137    Lipid Panel No results found for: CHOL, TRIG, HDL, CHOLHDL, VLDL, LDLCALC, LDLDIRECT    Wt Readings from Last 3 Encounters:  05/17/18 218 lb (98.9 kg)  05/04/18 213 lb (96.6 kg)  10/25/13 175 lb (79.4 kg)      ASSESSMENT AND PLAN:  # Atypical chest pain: He hasn't had any recurrent exertional symptoms.  However he has a prior tobacco and cocaine history.  Therefore we will get a coronary CT-A.  We will also get a copy of his lipids. Continue aspirin for now.  If he does not have any significant coronary disease this can be discontinued.     Current medicines are reviewed at length with the patient today.  The patient does not have concerns regarding medicines.  The following changes have been made:  no change  Labs/ tests ordered today include:   Orders Placed This Encounter  Procedures  . CT CORONARY MORPH W/CTA COR W/SCORE W/CA W/CM &/OR WO/CM  . CT CORONARY FRACTIONAL FLOW RESERVE DATA PREP  .  CT CORONARY FRACTIONAL FLOW RESERVE FLUID ANALYSIS  . Lipid panel  . Basic metabolic panel     Disposition:   FU with Emerald Shor C. Oval Linsey, MD, [facility] as needed.      Signed, Indie Boehne C. Oval Linsey, MD, [facility]  05/17/2018 5:46 PM    [place]

## 2018-05-17 NOTE — Patient Instructions (Addendum)
Medication Instructions:  TAKE METOPROLOL 50 MG 1 TABLET 1 HOUR PRIOR TO YOUR CT  Labwork: FASTING BMET/LIPID 1 WEEK PRIOR TO YOUR CT SCAN   Testing/Procedures: Your physician has requested that you have cardiac CT. Cardiac computed tomography (CT) is a painless test that uses an x-ray machine to take clear, detailed pictures of your heart. For further information please visit [email or web address]. Please follow instruction sheet as given. THE OFFICE WILL CALL YOU ONCE YOUR INSURANCE HAS APPROVED TO GET SCHEDULED   Follow-Up: AS NEEDED   Any Other Special Instructions Will Be Listed Below (If Applicable).   Please arrive at the [facility] main entrance of [facility] at xx:xx AM (30-45 minutes prior to test start time)  [facility] [place], [place] [id number] [phone]  Proceed to the [facility] Radiology Department (First Floor).  Please follow these instructions carefully (unless otherwise directed):  Hold all erectile dysfunction medications at least 48 hours prior to test.  On the Night Before the Test: . Drink plenty of water. . Do not consume any caffeinated/decaffeinated beverages or chocolate 12 hours prior to your test. . Do not take any antihistamines 12 hours prior to your test. . If you take Metformin do not take 24 hours prior to test. . If the patient has contrast allergy: ? Patient will need a prescription for Prednisone and very clear instructions (as follows): 1. Prednisone 50 mg - take 13 hours prior to test 2. Take another Prednisone 50 mg 7 hours prior to test 3. Take another Prednisone 50 mg 1 hour prior to test 4. Take Benadryl 50 mg 1 hour prior to test . Patient must complete all four doses of above prophylactic medications. . Patient will need a ride after test due to Benadryl.  On the Day of the Test: . Drink plenty of water. Do not drink any water within one hour of the test. . Do not eat any food 4 hours  prior to the test. . You may take your regular medications prior to the test. . IF NOT ON A BETA BLOCKER - Take 50 mg of lopressor (metoprolol) one hour before the test. . HOLD Furosemide morning of the test.  After the Test: . Drink plenty of water. . After receiving IV contrast, you may experience a mild flushed feeling. This is normal. . On occasion, you may experience a mild rash up to 24 hours after the test. This is not dangerous. If this occurs, you can take Benadryl 25 mg and increase your fluid intake. . If you experience trouble breathing, this can be serious. If it is severe call 911 IMMEDIATELY. If it is mild, please call our office. . If you take any of these medications: Glipizide/Metformin, Avandament, Glucavance, please do not take 48 hours after completing test.    Cardiac CT Angiogram A cardiac CT angiogram is a procedure to look at the heart and the area around the heart. It may be done to help find the cause of chest pains or other symptoms of heart disease. During this procedure, a large X-ray machine, called a CT scanner, takes detailed pictures of the heart and the surrounding area after a dye (contrast material) has been injected into blood vessels in the area. The procedure is also sometimes called a coronary CT angiogram, coronary artery scanning, or CTA. A cardiac CT angiogram allows the health care provider to see how well blood is flowing to and from the heart. The health care  provider will be able to see if there are any problems, such as:  Blockage or narrowing of the coronary arteries in the heart.  Fluid around the heart.  Signs of weakness or disease in the muscles, valves, and tissues of the heart.  Tell a health care provider about:  Any allergies you have. This is especially important if you have had a previous allergic reaction to contrast dye.  All medicines you are taking, including vitamins, herbs, eye drops, creams, and over-the-counter  medicines.  Any blood disorders you have.  Any surgeries you have had.  Any medical conditions you have.  Whether you are pregnant or may be pregnant.  Any anxiety disorders, chronic pain, or other conditions you have that may increase your stress or prevent you from lying still. What are the risks? Generally, this is a safe procedure. However, problems may occur, including:  Bleeding.  Infection.  Allergic reactions to medicines or dyes.  Damage to other structures or organs.  Kidney damage from the dye or contrast that is used.  Increased risk of cancer from radiation exposure. This risk is low. Talk with your health care provider about: ? The risks and benefits of testing. ? How you can receive the lowest dose of radiation.  What happens before the procedure?  Wear comfortable clothing and remove any jewelry, glasses, dentures, and hearing aids.  Follow instructions from your health care provider about eating and drinking. This may include: ? For 12 hours before the test - avoid caffeine. This includes tea, coffee, soda, energy drinks, and diet pills. Drink plenty of water or other fluids that do not have caffeine in them. Being well-hydrated can prevent complications. ? For 4-6 hours before the test - stop eating and drinking. The contrast dye can cause nausea, but this is less likely if your stomach is empty.  Ask your health care provider about changing or stopping your regular medicines. This is especially important if you are taking diabetes medicines, blood thinners, or medicines to treat erectile dysfunction. What happens during the procedure?  Hair on your chest may need to be removed so that small sticky patches called electrodes can be placed on your chest. These will transmit information that helps to monitor your heart during the test.  An IV tube will be inserted into one of your veins.  You might be given a medicine to control your heart rate during the  test. This will help to ensure that good images are obtained.  You will be asked to lie on an exam table. This table will slide in and out of the CT machine during the procedure.  Contrast dye will be injected into the IV tube. You might feel warm, or you may get a metallic taste in your mouth.  You will be given a medicine (nitroglycerin) to relax (dilate) the arteries in your heart.  The table that you are lying on will move into the CT machine tunnel for the scan.  The person running the machine will give you instructions while the scans are being done. You may be asked to: ? Keep your arms above your head. ? Hold your breath. ? Stay very still, even if the table is moving.  When the scanning is complete, you will be moved out of the machine.  The IV tube will be removed. The procedure may vary among health care providers and hospitals. What happens after the procedure?  You might feel warm, or you may get a metallic taste  in your mouth from the contrast dye.  You may have a headache from the nitroglycerin.  After the procedure, drink water or other fluids to wash (flush) the contrast material out of your body.  Contact a health care provider if you have any symptoms of allergy to the contrast. These symptoms include: ? Shortness of breath. ? Rash or hives. ? A racing heartbeat.  Most people can return to their normal activities right after the procedure. Ask your health care provider what activities are safe for you.  It is up to you to get the results of your procedure. Ask your health care provider, or the department that is doing the procedure, when your results will be ready. Summary  A cardiac CT angiogram is a procedure to look at the heart and the area around the heart. It may be done to help find the cause of chest pains or other symptoms of heart disease.  During this procedure, a large X-ray machine, called a CT scanner, takes detailed pictures of the heart and  the surrounding area after a dye (contrast material) has been injected into blood vessels in the area.  Ask your health care provider about changing or stopping your regular medicines before the procedure. This is especially important if you are taking diabetes medicines, blood thinners, or medicines to treat erectile dysfunction.  After the procedure, drink water or other fluids to wash (flush) the contrast material out of your body. This information is not intended to replace advice given to you by your health care provider. Make sure you discuss any questions you have with your health care provider. Document Released: 08/19/2008 Document Revised: 07/26/2016 Document Reviewed: 07/26/2016 Elsevier Interactive Patient Education  2017 [employer].

## 2018-07-11 ENCOUNTER — Encounter: Payer: Managed Care, Other (non HMO) | Admitting: *Deleted

## 2018-07-11 ENCOUNTER — Ambulatory Visit (HOSPITAL_COMMUNITY): Admission: RE | Admit: 2018-07-11 | Payer: Managed Care, Other (non HMO) | Source: Ambulatory Visit

## 2018-07-11 ENCOUNTER — Ambulatory Visit (HOSPITAL_COMMUNITY)
Admission: RE | Admit: 2018-07-11 | Discharge: 2018-07-11 | Disposition: A | Discharge status: Home / Self Care | Payer: Managed Care, Other (non HMO) | Source: Ambulatory Visit | Attending: Cardiovascular Disease | Admitting: Cardiovascular Disease

## 2018-07-11 DIAGNOSIS — R072 Precordial pain: Secondary | ICD-10-CM

## 2018-07-11 DIAGNOSIS — Z006 Encounter for examination for normal comparison and control in clinical research program: Secondary | ICD-10-CM

## 2018-07-11 MED ORDER — METOPROLOL TARTRATE 5 MG/5ML IV SOLN
INTRAVENOUS | Status: AC
  Filled 2018-07-11: qty 20

## 2018-07-11 MED ORDER — NITROGLYCERIN 0.4 MG SL SUBL
0.8000 mg | SUBLINGUAL_TABLET | Freq: Once | SUBLINGUAL | Status: AC
  Administered 2018-07-11: 0.8 mg via SUBLINGUAL
  Filled 2018-07-11: qty 25

## 2018-07-11 MED ORDER — IOPAMIDOL (ISOVUE-370) INJECTION 76%
100.0000 mL | Freq: Once | INTRAVENOUS | Status: AC
  Administered 2018-07-11: 80 mL via INTRAVENOUS

## 2018-07-11 MED ORDER — METOPROLOL TARTRATE 5 MG/5ML IV SOLN
5.0000 mg | INTRAVENOUS | Status: DC | PRN
  Administered 2018-07-11 (×2): 5 mg via INTRAVENOUS
  Filled 2018-07-11: qty 5

## 2018-07-11 MED ORDER — NITROGLYCERIN 0.4 MG SL SUBL
SUBLINGUAL_TABLET | SUBLINGUAL | Status: AC
  Filled 2018-07-11: qty 2

## 2018-07-11 NOTE — Research (Signed)
Subject met inclusion and exclusion criteria.  The informed consent form, study requirements and expectations were reviewed with the subject and questions and concerns were addressed prior to the signing of the consent form.  The subject verbalized understanding of the trial requirements.  The subject agreed to participate in the CADfem trial and signed the informed consent.  The informed consent was obtained prior to performance of any protocol-specific procedures for the subject.  A copy of the signed informed consent was given to the subject and a copy was placed in the subject's medical record. 

## 2018-07-11 NOTE — Progress Notes (Signed)
CT scan completed. Tolerated well. D/C home walking awake and alert. In no distress 

## 2019-08-21 ENCOUNTER — Other Ambulatory Visit: Payer: Self-pay

## 2019-08-21 DIAGNOSIS — Z20822 Contact with and (suspected) exposure to covid-19: Secondary | ICD-10-CM

## 2019-08-23 LAB — NOVEL CORONAVIRUS, NAA: SARS-CoV-2, NAA: NOT DETECTED

## 2019-09-15 ENCOUNTER — Ambulatory Visit (HOSPITAL_COMMUNITY)
Admission: EM | Admit: 2019-09-15 | Discharge: 2019-09-15 | Disposition: A | Discharge status: Home / Self Care | Payer: Managed Care, Other (non HMO) | Attending: Family Medicine | Admitting: Family Medicine

## 2019-09-15 ENCOUNTER — Other Ambulatory Visit: Payer: Self-pay

## 2019-09-15 ENCOUNTER — Encounter (HOSPITAL_COMMUNITY): Payer: Self-pay | Admitting: *Deleted

## 2019-09-15 DIAGNOSIS — R369 Urethral discharge, unspecified: Secondary | ICD-10-CM

## 2019-09-15 DIAGNOSIS — Z113 Encounter for screening for infections with a predominantly sexual mode of transmission: Secondary | ICD-10-CM

## 2019-09-15 DIAGNOSIS — Z202 Contact with and (suspected) exposure to infections with a predominantly sexual mode of transmission: Secondary | ICD-10-CM | POA: Diagnosis not present

## 2019-09-15 MED ORDER — CEFTRIAXONE SODIUM 250 MG IJ SOLR
INTRAMUSCULAR | Status: AC
  Filled 2019-09-15: qty 250

## 2019-09-15 MED ORDER — LIDOCAINE HCL (PF) 1 % IJ SOLN
INTRAMUSCULAR | Status: AC
  Filled 2019-09-15: qty 2

## 2019-09-15 MED ORDER — CEFTRIAXONE SODIUM 250 MG IJ SOLR
250.0000 mg | Freq: Once | INTRAMUSCULAR | Status: AC
  Administered 2019-09-15: 11:00:00 250 mg via INTRAMUSCULAR

## 2019-09-15 MED ORDER — AZITHROMYCIN 250 MG PO TABS
1000.0000 mg | ORAL_TABLET | Freq: Once | ORAL | Status: AC
  Administered 2019-09-15: 11:00:00 1000 mg via ORAL

## 2019-09-15 MED ORDER — AZITHROMYCIN 250 MG PO TABS
ORAL_TABLET | ORAL | Status: AC
  Filled 2019-09-15: qty 4

## 2019-09-15 NOTE — ED Triage Notes (Signed)
Reports "yellow" semen x 2 episodes upon ejaculating over past week.  Denies any pain or discharge otherwise.

## 2019-09-15 NOTE — ED Provider Notes (Signed)
[facility]    CSN: [id number] Arrival date & time: 09/15/19  1005      History   Chief Complaint Chief Complaint  Patient presents with  . Penile Discharge    HPI Craig [name] is a 53 y.o. male.   Patient is a 53 year old male past medical history of bipolar, anxiety.  He presents today with discolored, yellow semen with ejaculation.  He has had 2 episodes of this over the past week.  Denies any dysuria, hematuria or urinary frequency.  Denies any rashes, testicle pain or swelling. Currently sexually active with one partner, unprotected.  Would like to be checked and treated for STDs.  ROS per HPI    Penile Discharge    Past Medical History:  Diagnosis Date  . Bipolar affective disorder ([place])   . Cocaine use    QUIT IN 2004  . GAD (generalized anxiety disorder)     Patient Active Problem List   Diagnosis Date Noted  . Fatigue 10/23/2013  . Weakness 10/23/2013  . Chest pain 10/23/2013  . Chest congestion 10/23/2013    Past Surgical History:  Procedure Laterality Date  . BACK SURGERY    . KNEE SURGERY     x2  . LEFT BICEPS TEAR  05/2011   x2  . [place] SURGERY  1999  . RIGHT HAND FRACTURE  2011       Home Medications    Prior to Admission medications   Medication Sig Start Date End Date Taking? Authorizing Provider  ALPRAZolam Duanne Moron) 1 MG tablet Take 1 mg by mouth at bedtime as needed for anxiety.   Yes [provider]  aspirin EC 81 MG tablet Take 1 tablet (81 mg total) by mouth daily. 10/25/13  Yes Nahser, Wonda Cheng, MD  cyclobenzaprine (FLEXERIL) 5 MG tablet Take 5 mg by mouth daily. 05/11/18  Yes [provider]  zolpidem (AMBIEN CR) 12.5 MG CR tablet Take 12.5 mg by mouth at bedtime as needed for sleep.   Yes [provider]  metoprolol tartrate (LOPRESSOR) 50 MG tablet 1 TABLET 1 HOUR PRIOR TO CT 05/17/18   Skeet Latch, MD    Family History Family History  Problem Relation Age of Onset  .  CAD Father   . Heart attack Mother   . Stroke Mother   . Lupus Mother   . Heart disease Maternal Grandfather   . Heart failure Maternal Grandfather     Social History Social History   Tobacco Use  . Smoking status: Former Research scientist (life sciences)  . Smokeless tobacco: Never Used  Substance Use Topics  . Alcohol use: Never  . Drug use: Yes    Types: Cocaine, Marijuana    Comment: No cocaine use currently     Allergies   Celexa [citalopram hydrobromide], Effexor [venlafaxine], Lexapro [escitalopram oxalate], Paxil [paroxetine hcl], and Zoloft [sertraline hcl]   Review of Systems Review of Systems  Genitourinary: Positive for discharge.     Physical Exam Triage Vital Signs ED Triage Vitals  Enc Vitals Group     BP 09/15/19 1027 131/81     Pulse Rate 09/15/19 1027 76     Resp 09/15/19 1027 14     Temp 09/15/19 1027 97.9 F (36.6 C)     Temp Source 09/15/19 1027 Oral     SpO2 09/15/19 1027 99 %     Weight --      Height --      Head Circumference --  Peak Flow --      Pain Score 09/15/19 1028 0     Pain Loc --      Pain Edu? --      Excl. in [place]? --    No data found.  Updated Vital Signs BP 131/81   Pulse 76   Temp 97.9 F (36.6 C) (Oral)   Resp 14   SpO2 99%   Visual Acuity Right Eye Distance:   Left Eye Distance:   Bilateral Distance:    Right Eye Near:   Left Eye Near:    Bilateral Near:     Physical Exam Vitals and nursing note reviewed.  Constitutional:      Appearance: Normal appearance.  HENT:     Head: Normocephalic and atraumatic.     Nose: Nose normal.  Eyes:     Conjunctiva/sclera: Conjunctivae normal.  Pulmonary:     Effort: Pulmonary effort is normal.  Abdominal:     Palpations: Abdomen is soft.     Tenderness: There is no abdominal tenderness.  Musculoskeletal:        General: Normal range of motion.     Cervical back: Normal range of motion.  Skin:    General: Skin is warm and dry.  Neurological:     Mental Status: He is alert.    Psychiatric:        Mood and Affect: Mood normal.      UC Treatments / Results  Labs (all labs ordered are listed, but only abnormal results are displayed) Labs Reviewed  CYTOLOGY, (ORAL, ANAL, URETHRAL) ANCILLARY ONLY    EKG   Radiology No results found.  Procedures Procedures (including critical care time)  Medications Ordered in UC Medications  cefTRIAXone (ROCEPHIN) injection 250 mg (has no administration in time range)  azithromycin (ZITHROMAX) tablet 1,000 mg (has no administration in time range)    Initial Impression / Assessment and Plan / UC Course  I have reviewed the triage vital signs and the nursing notes.  Pertinent labs & imaging results that were available during my care of the patient were reviewed by me and considered in my medical decision making (see chart for details).     STD screening-swab sent for testing. Treated prophylactically for gonorrhea and chlamydia today.  Labs pending Final Clinical Impressions(s) / UC Diagnoses   Final diagnoses:  Screening for STD (sexually transmitted disease)     Discharge Instructions     Treating you prophylactically for gonorrhea and chlamydia today. We will call you with any positive results on your swab.    ED Prescriptions    None     PDMP not reviewed this encounter.   Loura Halt A, NP 09/15/19 1045

## 2019-09-15 NOTE — Discharge Instructions (Addendum)
Treating you prophylactically for gonorrhea and chlamydia today. We will call you with any positive results on your swab.

## 2019-09-17 ENCOUNTER — Ambulatory Visit: Payer: Managed Care, Other (non HMO) | Attending: Internal Medicine

## 2019-09-17 DIAGNOSIS — Z20822 Contact with and (suspected) exposure to covid-19: Secondary | ICD-10-CM

## 2019-09-18 LAB — CYTOLOGY, (ORAL, ANAL, URETHRAL) ANCILLARY ONLY
Chlamydia: NEGATIVE
Neisseria Gonorrhea: NEGATIVE
Trichomonas: NEGATIVE

## 2019-09-19 LAB — NOVEL CORONAVIRUS, NAA: SARS-CoV-2, NAA: NOT DETECTED

## 2019-09-28 ENCOUNTER — Other Ambulatory Visit: Payer: Self-pay

## 2019-09-28 ENCOUNTER — Encounter (HOSPITAL_COMMUNITY): Payer: Self-pay | Admitting: Emergency Medicine

## 2019-09-28 ENCOUNTER — Emergency Department (HOSPITAL_COMMUNITY): Payer: Managed Care, Other (non HMO)

## 2019-09-28 ENCOUNTER — Emergency Department (HOSPITAL_COMMUNITY)
Admission: EM | Admit: 2019-09-28 | Discharge: 2019-09-28 | Disposition: A | Discharge status: Home / Self Care | Payer: Managed Care, Other (non HMO) | Attending: Emergency Medicine | Admitting: Emergency Medicine

## 2019-09-28 DIAGNOSIS — R0789 Other chest pain: Secondary | ICD-10-CM | POA: Diagnosis not present

## 2019-09-28 DIAGNOSIS — R071 Chest pain on breathing: Secondary | ICD-10-CM | POA: Diagnosis not present

## 2019-09-28 DIAGNOSIS — R079 Chest pain, unspecified: Secondary | ICD-10-CM | POA: Diagnosis present

## 2019-09-28 DIAGNOSIS — Z20822 Contact with and (suspected) exposure to covid-19: Secondary | ICD-10-CM | POA: Diagnosis not present

## 2019-09-28 DIAGNOSIS — Z7982 Long term (current) use of aspirin: Secondary | ICD-10-CM | POA: Insufficient documentation

## 2019-09-28 DIAGNOSIS — F1210 Cannabis abuse, uncomplicated: Secondary | ICD-10-CM | POA: Insufficient documentation

## 2019-09-28 DIAGNOSIS — Z87891 Personal history of nicotine dependence: Secondary | ICD-10-CM | POA: Diagnosis not present

## 2019-09-28 DIAGNOSIS — Z79899 Other long term (current) drug therapy: Secondary | ICD-10-CM | POA: Diagnosis not present

## 2019-09-28 DIAGNOSIS — F1410 Cocaine abuse, uncomplicated: Secondary | ICD-10-CM | POA: Diagnosis not present

## 2019-09-28 LAB — BASIC METABOLIC PANEL
Anion gap: 9 (ref 5–15)
BUN: 19 mg/dL (ref 6–20)
CO2: 26 mmol/L (ref 22–32)
Calcium: 9.5 mg/dL (ref 8.9–10.3)
Chloride: 101 mmol/L (ref 98–111)
Creatinine, Ser: 1.05 mg/dL (ref 0.61–1.24)
GFR calc Af Amer: >60 mL/min (ref >60)
GFR calc non Af Amer: >60 mL/min (ref >60)
Glucose, Bld: 117 mg/dL — ABNORMAL HIGH (ref 70–99)
Potassium: 4.3 mmol/L (ref 3.5–5.1)
Sodium: 136 mmol/L (ref 135–145)

## 2019-09-28 LAB — D-DIMER, QUANTITATIVE: D-Dimer, Quant: <0.27 ug/mL-FEU (ref 0.00–0.50)

## 2019-09-28 LAB — CBC
HCT: 50.8 % (ref 39.0–52.0)
Hemoglobin: 17.4 g/dL — ABNORMAL HIGH (ref 13.0–17.0)
MCH: 33.3 pg (ref 26.0–34.0)
MCHC: 34.3 g/dL (ref 30.0–36.0)
MCV: 97.1 fL (ref 80.0–100.0)
Platelets: 247 10*3/uL (ref 150–400)
RBC: 5.23 MIL/uL (ref 4.22–5.81)
RDW: 12.9 % (ref 11.5–15.5)
WBC: 15.6 10*3/uL — ABNORMAL HIGH (ref 4.0–10.5)
nRBC: 0 % (ref 0.0–0.2)

## 2019-09-28 LAB — TROPONIN I (HIGH SENSITIVITY)
Troponin I (High Sensitivity): <2 ng/L (ref <18)
Troponin I (High Sensitivity): <2 ng/L (ref <18)

## 2019-09-28 MED ORDER — MORPHINE SULFATE (PF) 4 MG/ML IV SOLN
4.0000 mg | Freq: Once | INTRAVENOUS | Status: AC
  Administered 2019-09-28: 22:00:00 4 mg via INTRAVENOUS
  Filled 2019-09-28: qty 1

## 2019-09-28 MED ORDER — MORPHINE SULFATE (PF) 4 MG/ML IV SOLN
4.0000 mg | Freq: Once | INTRAVENOUS | Status: AC
  Administered 2019-09-28: 4 mg via INTRAVENOUS
  Filled 2019-09-28: qty 1

## 2019-09-28 MED ORDER — IOHEXOL 350 MG/ML SOLN
100.0000 mL | Freq: Once | INTRAVENOUS | Status: AC | PRN
  Administered 2019-09-28: 21:00:00 100 mL via INTRAVENOUS

## 2019-09-28 MED ORDER — ALUM & MAG HYDROXIDE-SIMETH 200-200-20 MG/5ML PO SUSP
30.0000 mL | Freq: Once | ORAL | Status: AC
  Administered 2019-09-28: 18:00:00 30 mL via ORAL
  Filled 2019-09-28: qty 30

## 2019-09-28 MED ORDER — LIDOCAINE VISCOUS HCL 2 % MT SOLN
15.0000 mL | Freq: Once | OROMUCOSAL | Status: AC
  Administered 2019-09-28: 18:00:00 15 mL via ORAL
  Filled 2019-09-28: qty 15

## 2019-09-28 NOTE — ED Triage Notes (Signed)
Pt c/o central chest pains that radiates to his back that started around 10am today. Reports taking one Aspirin 81mg  this morning when symptoms started. Pains are worse with movement.

## 2019-09-28 NOTE — ED Notes (Signed)
Pt ambulated down the hall and back to his room on 96% room air.Pt states he was having chest pain as while he was ambulating.

## 2019-09-28 NOTE — ED Provider Notes (Signed)
[facility] DEPT Provider Note   CSN: [id number] Arrival date & time: 09/28/19  1532     History Chief Complaint  Patient presents with  . Chest Pain    Craig [name] is a 54 y.o. male.  The history is provided by the patient and medical records. No language interpreter was used.  Chest Pain      54 year old male with history of bipolar, remote history of cocaine use, generalized anxiety disorder presenting for evaluation of chest pain.  Patient report this morning he was just sitting around when he developed pain to his chest.  He described pain as a sharp stabbing sensation to the left upper chest radiates straight to his back between the shoulder blade.  Pain has been persistent, moderate to severe, worsening when he takes a deep breath, or when he lays down.  Pain not adequately relieved with taking aspirin and ibuprofen at home.  No associated fever, chills, lightheadedness, dizziness, headache, neck pain, arm pain, nausea, vomiting, diarrhea, abdominal pain, focal numbness or focal weakness.  No runny nose sneezing or coughing.  No COVID-19 symptoms.  No leg pain is calf swelling.  No prior history of PE or DVT, no history of AAA.  No recent surgery or prolonged bedrest.  He denies alcohol or tobacco use but admits to occasional marijuana use.  He denies any strenuous activities or heavy lifting recently.  No dietary changes.  Past Medical History:  Diagnosis Date  . Bipolar affective disorder ([place])   . Cocaine use    QUIT IN 2004  . GAD (generalized anxiety disorder)     Patient Active Problem List   Diagnosis Date Noted  . Fatigue 10/23/2013  . Weakness 10/23/2013  . Chest pain 10/23/2013  . Chest congestion 10/23/2013    Past Surgical History:  Procedure Laterality Date  . BACK SURGERY    . KNEE SURGERY     x2  . LEFT BICEPS TEAR  05/2011   x2  . [place] SURGERY  1999  . RIGHT HAND FRACTURE  2011       Family History    Problem Relation Age of Onset  . CAD Father   . Heart attack Mother   . Stroke Mother   . Lupus Mother   . Heart disease Maternal Grandfather   . Heart failure Maternal Grandfather     Social History   Tobacco Use  . Smoking status: Former Research scientist (life sciences)  . Smokeless tobacco: Never Used  Substance Use Topics  . Alcohol use: Never  . Drug use: Yes    Types: Cocaine, Marijuana    Home Medications Prior to Admission medications   Medication Sig Start Date End Date Taking? Authorizing Provider  ALPRAZolam Duanne Moron) 1 MG tablet Take 1 mg by mouth at bedtime as needed for anxiety.    [provider]  aspirin EC 81 MG tablet Take 1 tablet (81 mg total) by mouth daily. 10/25/13   Nahser, Wonda Cheng, MD  cyclobenzaprine (FLEXERIL) 5 MG tablet Take 5 mg by mouth daily. 05/11/18   [provider]  metoprolol tartrate (LOPRESSOR) 50 MG tablet 1 TABLET 1 HOUR PRIOR TO CT 05/17/18   Skeet Latch, MD  zolpidem (AMBIEN CR) 12.5 MG CR tablet Take 12.5 mg by mouth at bedtime as needed for sleep.    [provider]    Allergies    Celexa [citalopram hydrobromide], Effexor [venlafaxine], Lexapro [escitalopram oxalate], Paxil [paroxetine hcl], and Zoloft [sertraline hcl]  Review  of Systems   Review of Systems  Cardiovascular: Positive for chest pain.  All other systems reviewed and are negative.   Physical Exam Updated Vital Signs BP (!) 168/81 (BP Location: Right Arm)   Pulse (!) 104   Temp 98.5 F (36.9 C) (Oral)   Resp 20   Ht 5' 10.5" (1.791 m)   Wt 88.5 kg   SpO2 100%   BMI 27.58 kg/m   Physical Exam Vitals and nursing note reviewed.  Constitutional:      General: He is not in acute distress.    Appearance: He is well-developed.  HENT:     Head: Atraumatic.  Eyes:     Conjunctiva/sclera: Conjunctivae normal.  Cardiovascular:     Rate and Rhythm: Normal rate and regular rhythm.     Heart sounds: Normal heart sounds.  Pulmonary:     Effort: Pulmonary  effort is normal. Tachypnea present.     Breath sounds: Normal breath sounds. No decreased breath sounds, wheezing, rhonchi or rales.  Chest:     Chest wall: No tenderness.  Abdominal:     Palpations: Abdomen is soft.  Musculoskeletal:     Cervical back: Neck supple.     Right lower leg: No edema.     Left lower leg: No edema.  Skin:    Capillary Refill: Capillary refill takes less than 2 seconds.     Findings: No rash.  Neurological:     Mental Status: He is alert and oriented to person, place, and time.     ED Results / Procedures / Treatments   Labs (all labs ordered are listed, but only abnormal results are displayed) Labs Reviewed  BASIC METABOLIC PANEL - Abnormal; Notable for the following components:      Result Value   Glucose, Bld 117 (*)    All other components within normal limits  CBC - Abnormal; Notable for the following components:   WBC 15.6 (*)    Hemoglobin 17.4 (*)    All other components within normal limits  SARS CORONAVIRUS 2 (TAT 6-24 HRS)  D-DIMER, QUANTITATIVE (NOT AT [facility])  TROPONIN I (HIGH SENSITIVITY)  TROPONIN I (HIGH SENSITIVITY)    EKG EKG Interpretation  Date/Time:  Friday September 28 2019 15:44:58 EST Ventricular Rate:  99 PR Interval:    QRS Duration: 97 QT Interval:  309 QTC Calculation: 397 R Axis:   -26 Text Interpretation: Sinus rhythm Borderline left axis deviation Abnormal R-wave progression, early transition Borderline ST elevation, anterior leads Baseline wander in lead(s) V2 Confirmed by Milton Ferguson [phone]) on 09/28/2019 8:08:01 PM   ED ECG REPORT   Date: 09/28/2019  Rate: 99  Rhythm: normal sinus rhythm  QRS Axis: left  Intervals: normal  ST/T Wave abnormalities: nonspecific ST changes  Conduction Disutrbances:none  Narrative Interpretation:   Old EKG Reviewed: unchanged  I have personally reviewed the EKG tracing and agree with the computerized printout as noted.   Radiology DG Chest 2 View  Result Date:  09/28/2019 CLINICAL DATA:  Chest pain EXAM: CHEST - 2 VIEW COMPARISON:  None. FINDINGS: The heart size and mediastinal contours are within normal limits. Both lungs are clear. No pleural effusion or pneumothorax. The visualized skeletal structures are unremarkable. IMPRESSION: No acute process in the chest. Electronically Signed   By: Macy Mis M.D.   On: 09/28/2019 16:57   CT Angio Chest/Abd/Pel for Dissection W and/or Wo Contrast  Result Date: 09/28/2019 CLINICAL DATA:  Chest pain radiating to the back EXAM: CT  ANGIOGRAPHY CHEST, ABDOMEN AND PELVIS TECHNIQUE: Multidetector CT imaging through the chest, abdomen and pelvis was performed using the standard protocol prior to and during bolus administration of intravenous contrast. Multiplanar reconstructed images and MIPs were obtained and reviewed to evaluate the vascular anatomy. CONTRAST:  1101mL OMNIPAQUE IOHEXOL 350 MG/ML SOLN COMPARISON:  None. FINDINGS: CTA CHEST FINDINGS Cardiovascular: Satisfactory opacification of the thoracic aorta. No evidence of thoracic aortic aneurysm or dissection. Normal heart size. No pericardial effusion. Mediastinum/Nodes: No enlarged mediastinal, hilar, or axillary lymph nodes. Thyroid gland, trachea, and esophagus demonstrate no significant findings. Lungs/Pleura: Lungs are clear. No pleural effusion or pneumothorax. Musculoskeletal: No chest wall abnormality. Degenerative changes of the thoracic spine Review of the MIP images confirms the above findings. CTA ABDOMEN AND PELVIS FINDINGS VASCULAR Aorta: Normal caliber aorta without aneurysm, dissection, vasculitis or significant stenosis. Celiac: Patent. SMA: Patent. Renals: Patent. IMA: Patent. Inflow: Patent. Veins: Not well evaluated. Review of the MIP images confirms the above findings. NON-VASCULAR Hepatobiliary: No focal liver abnormality is seen. No gallstones, gallbladder wall thickening, or biliary dilatation. Pancreas: Unremarkable Spleen: Heterogeneous secondary  to arterial phase Adrenals/Urinary Tract: Adrenal glands are unremarkable. Kidneys are normal, without renal calculi, focal lesion, or hydronephrosis. Bladder is unremarkable. Stomach/Bowel: Stomach is within normal limits. Appendix appears normal. No evidence of bowel wall thickening, distention, or inflammatory changes. Lymphatic: No adenopathy Reproductive: Prostate is unremarkable. Other: No ascites or abnormality of the abdominal wall Musculoskeletal: Hemangioma within T12 vertebral body. Degenerative changes of the lumbar spine, greatest at L4-L5 and L5-S1. Review of the MIP images confirms the above findings. IMPRESSION: No evidence of dissection.  No acute abnormality. Electronically Signed   By: Macy Mis M.D.   On: 09/28/2019 21:00    Procedures Procedures (including critical care time)  Medications Ordered in ED Medications  morphine 4 MG/ML injection 4 mg (has no administration in time range)  morphine 4 MG/ML injection 4 mg (4 mg Intravenous Given 09/28/19 1829)  alum & mag hydroxide-simeth (MAALOX/MYLANTA) 200-200-20 MG/5ML suspension 30 mL (30 mLs Oral Given 09/28/19 1829)    And  lidocaine (XYLOCAINE) 2 % viscous mouth solution 15 mL (15 mLs Oral Given 09/28/19 1829)  iohexol (OMNIPAQUE) 350 MG/ML injection 100 mL (100 mLs Intravenous Contrast Given 09/28/19 2041)    ED Course  I have reviewed the triage vital signs and the nursing notes.  Pertinent labs & imaging results that were available during my care of the patient were reviewed by me and considered in my medical decision making (see chart for details).    MDM Rules/Calculators/A&P                      BP (!) 168/81 (BP Location: Right Arm)   Pulse (!) 104   Temp 98.5 F (36.9 C) (Oral)   Resp 20   Ht 5' 10.5" (1.791 m)   Wt 88.5 kg   SpO2 100%   BMI 27.58 kg/m   Final Clinical Impression(s) / ED Diagnoses Final diagnoses:  Atypical chest pain    Rx / DC Orders ED Discharge Orders    None     5:50  PM Patient with sharp pleuritic left-sided upper chest pain radiates to back since early this morning.  He is well-appearing, pain is not reproducible on exam.  Initial chest x-ray unremarkable. Vital sign is stable.  Doubt PE or dissection.  Will check d-dimer.  sxs not consistent with covid-19.  Pain atypical of acs.  HEART score of 2,  low risk of MACE. GI cocktail and pain medication given.    9:44 PM Due to patient's symptoms and out of abundance of precaution, a chest abdomen pelvis CT angiogram was performed to rule out aortic dissection.  Fortunately the CT scan is without any acute finding.  Patient is currently resting comfortably and stable for discharge.  Encourage patient to follow-up with PCP for further care.  Return precaution discussed.  Care discussed with Dr. Roderic Palau. A covid-19 test was obtained today and have not resulted yet.  Craig [name] was evaluated in Emergency Department on 09/28/2019 for the symptoms described in the history of present illness. He was evaluated in the context of the global COVID-19 pandemic, which necessitated consideration that the patient might be at risk for infection with the SARS-CoV-2 virus that causes COVID-19. Institutional protocols and algorithms that pertain to the evaluation of patients at risk for COVID-19 are in a state of rapid change based on information released by regulatory bodies including the CDC and federal and state organizations. These policies and algorithms were followed during the patient's care in the ED.    Domenic Moras, PA-C 09/28/19 2145    Milton Ferguson, MD 09/28/19 [phone]

## 2019-09-29 LAB — SARS CORONAVIRUS 2 (TAT 6-24 HRS): SARS Coronavirus 2: NEGATIVE

## 2019-10-16 ENCOUNTER — Emergency Department (HOSPITAL_COMMUNITY): Payer: Managed Care, Other (non HMO)

## 2019-10-16 ENCOUNTER — Encounter (HOSPITAL_COMMUNITY): Payer: Self-pay | Admitting: *Deleted

## 2019-10-16 ENCOUNTER — Emergency Department (HOSPITAL_COMMUNITY)
Admission: EM | Admit: 2019-10-16 | Discharge: 2019-10-17 | Disposition: A | Discharge status: Home / Self Care | Payer: Managed Care, Other (non HMO) | Attending: Emergency Medicine | Admitting: Emergency Medicine

## 2019-10-16 ENCOUNTER — Other Ambulatory Visit: Payer: Self-pay

## 2019-10-16 DIAGNOSIS — Z79899 Other long term (current) drug therapy: Secondary | ICD-10-CM | POA: Insufficient documentation

## 2019-10-16 DIAGNOSIS — R791 Abnormal coagulation profile: Secondary | ICD-10-CM | POA: Diagnosis not present

## 2019-10-16 DIAGNOSIS — Z87891 Personal history of nicotine dependence: Secondary | ICD-10-CM | POA: Insufficient documentation

## 2019-10-16 DIAGNOSIS — R519 Headache, unspecified: Secondary | ICD-10-CM | POA: Insufficient documentation

## 2019-10-16 DIAGNOSIS — R410 Disorientation, unspecified: Secondary | ICD-10-CM | POA: Diagnosis not present

## 2019-10-16 LAB — DIFFERENTIAL
Abs Immature Granulocytes: 0.01 10*3/uL (ref 0.00–0.07)
Basophils Absolute: 0.1 10*3/uL (ref 0.0–0.1)
Basophils Relative: 1 %
Eosinophils Absolute: 0.2 10*3/uL (ref 0.0–0.5)
Eosinophils Relative: 3 %
Immature Granulocytes: 0 %
Lymphocytes Relative: 36 %
Lymphs Abs: 2.4 10*3/uL (ref 0.7–4.0)
Monocytes Absolute: 0.6 10*3/uL (ref 0.1–1.0)
Monocytes Relative: 10 %
Neutro Abs: 3.3 10*3/uL (ref 1.7–7.7)
Neutrophils Relative %: 50 %

## 2019-10-16 LAB — COMPREHENSIVE METABOLIC PANEL
ALT: 28 U/L (ref 0–44)
AST: 29 U/L (ref 15–41)
Albumin: 4.4 g/dL (ref 3.5–5.0)
Alkaline Phosphatase: 53 U/L (ref 38–126)
Anion gap: 9 (ref 5–15)
BUN: 20 mg/dL (ref 6–20)
CO2: 27 mmol/L (ref 22–32)
Calcium: 9.5 mg/dL (ref 8.9–10.3)
Chloride: 103 mmol/L (ref 98–111)
Creatinine, Ser: 1.08 mg/dL (ref 0.61–1.24)
GFR calc Af Amer: >60 mL/min (ref >60)
GFR calc non Af Amer: >60 mL/min (ref >60)
Glucose, Bld: 108 mg/dL — ABNORMAL HIGH (ref 70–99)
Potassium: 3.9 mmol/L (ref 3.5–5.1)
Sodium: 139 mmol/L (ref 135–145)
Total Bilirubin: 1.5 mg/dL — ABNORMAL HIGH (ref 0.3–1.2)
Total Protein: 7.2 g/dL (ref 6.5–8.1)

## 2019-10-16 LAB — URINALYSIS, ROUTINE W REFLEX MICROSCOPIC
Bilirubin Urine: NEGATIVE
Glucose, UA: NEGATIVE mg/dL
Hgb urine dipstick: NEGATIVE
Ketones, ur: NEGATIVE mg/dL
Leukocytes,Ua: NEGATIVE
Nitrite: NEGATIVE
Protein, ur: NEGATIVE mg/dL
Specific Gravity, Urine: 1.021 (ref 1.005–1.030)
pH: 7 (ref 5.0–8.0)

## 2019-10-16 LAB — I-STAT CHEM 8, ED
BUN: 21 mg/dL — ABNORMAL HIGH (ref 6–20)
Calcium, Ion: 1.19 mmol/L (ref 1.15–1.40)
Chloride: 102 mmol/L (ref 98–111)
Creatinine, Ser: 1.1 mg/dL (ref 0.61–1.24)
Glucose, Bld: 109 mg/dL — ABNORMAL HIGH (ref 70–99)
HCT: 49 % (ref 39.0–52.0)
Hemoglobin: 16.7 g/dL (ref 13.0–17.0)
Potassium: 3.7 mmol/L (ref 3.5–5.1)
Sodium: 139 mmol/L (ref 135–145)
TCO2: 27 mmol/L (ref 22–32)

## 2019-10-16 LAB — RAPID URINE DRUG SCREEN, HOSP PERFORMED
Amphetamines: NOT DETECTED
Barbiturates: NOT DETECTED
Benzodiazepines: POSITIVE — AB
Cocaine: NOT DETECTED
Opiates: NOT DETECTED
Tetrahydrocannabinol: POSITIVE — AB

## 2019-10-16 LAB — CBC
HCT: 50.1 % (ref 39.0–52.0)
Hemoglobin: 17.1 g/dL — ABNORMAL HIGH (ref 13.0–17.0)
MCH: 32.4 pg (ref 26.0–34.0)
MCHC: 34.1 g/dL (ref 30.0–36.0)
MCV: 94.9 fL (ref 80.0–100.0)
Platelets: 258 10*3/uL (ref 150–400)
RBC: 5.28 MIL/uL (ref 4.22–5.81)
RDW: 12.5 % (ref 11.5–15.5)
WBC: 6.5 10*3/uL (ref 4.0–10.5)
nRBC: 0 % (ref 0.0–0.2)

## 2019-10-16 LAB — PROTIME-INR
INR: 1 (ref 0.8–1.2)
Prothrombin Time: 13 seconds (ref 11.4–15.2)

## 2019-10-16 LAB — APTT: aPTT: 28 seconds (ref 24–36)

## 2019-10-16 LAB — ETHANOL: Alcohol, Ethyl (B): <10 mg/dL (ref <10)

## 2019-10-16 MED ORDER — PROCHLORPERAZINE EDISYLATE 10 MG/2ML IJ SOLN
10.0000 mg | Freq: Once | INTRAMUSCULAR | Status: AC
  Administered 2019-10-16: 17:00:00 10 mg via INTRAVENOUS
  Filled 2019-10-16: qty 2

## 2019-10-16 MED ORDER — DIPHENHYDRAMINE HCL 50 MG/ML IJ SOLN
25.0000 mg | Freq: Once | INTRAMUSCULAR | Status: AC
  Administered 2019-10-16: 17:00:00 25 mg via INTRAVENOUS
  Filled 2019-10-16: qty 1

## 2019-10-16 MED ORDER — SODIUM CHLORIDE (PF) 0.9 % IJ SOLN
INTRAMUSCULAR | Status: AC
  Filled 2019-10-16: qty 50

## 2019-10-16 MED ORDER — IOHEXOL 350 MG/ML SOLN
100.0000 mL | Freq: Once | INTRAVENOUS | Status: AC | PRN
  Administered 2019-10-16: 16:00:00 100 mL via INTRAVENOUS

## 2019-10-16 MED ORDER — SODIUM CHLORIDE 0.9 % IV BOLUS
1000.0000 mL | Freq: Once | INTRAVENOUS | Status: AC
  Administered 2019-10-16: 17:00:00 1000 mL via INTRAVENOUS

## 2019-10-16 NOTE — ED Triage Notes (Signed)
Pt started Friday he could not remember his passwords or able to read caption on TV, felt ok weekend, talked with mother this morning, called PCP and was sent here. HA only today

## 2019-10-16 NOTE — ED Provider Notes (Signed)
[facility] DEPT Provider Note   CSN: [id number] Arrival date & time: 10/16/19  1402     History Chief Complaint  Patient presents with  . Headache  . AMS    Craig [name] is a 54 y.o. male.  Craig [name] is a 55 y.o. male with history of bipolar disorder, and generalized anxiety who presents to the emergency department for confusion and headache.  Patient states that Friday evening around 5 PM he started out at work.  He states that a few hours later he developed a sudden onset frontal headache that lasted throughout the night.  He states that he was trying to watch TV with his wife could not read the text on the TV, because it was blurry.  He went to bed and his vision seemed to be normal when he woke up and his headache had resolved but he reports throughout the weekend he continued to feel all, like he was mentally slowed and could not remember things or respond at his usual case.  He denies experiencing any facial droop, any additional vision changes, no numbness, tingling or weakness in his arms or legs.  His wife did take him at one point that some of his responses were not making sense.  He states that this morning he woke up and again had a mild headache called on him swelling was more gradual in onset and continued to feel some intermittent confusion.  He called his primary doctor who recommended he come in for further evaluation.  He denies any history of similar symptoms.  He reports he has history of anxiety and bipolar disorder but is otherwise healthy.  Reports marijuana use but denies other drug use, as prescribed Xanax.  Patient does state that he is currently feeling anxious.        Past Medical History:  Diagnosis Date  . Bipolar affective disorder ([place])   . Cocaine use    QUIT IN 2004  . GAD (generalized anxiety disorder)     Patient Active Problem List   Diagnosis Date Noted  . Fatigue 10/23/2013  . Weakness 10/23/2013  .  Chest pain 10/23/2013  . Chest congestion 10/23/2013    Past Surgical History:  Procedure Laterality Date  . BACK SURGERY    . KNEE SURGERY     x2  . LEFT BICEPS TEAR  05/2011   x2  . [facility] SURGERY  1999  . RIGHT HAND FRACTURE  2011       Family History  Problem Relation Age of Onset  . CAD Father   . Heart attack Mother   . Stroke Mother   . Lupus Mother   . Heart disease Maternal Grandfather   . Heart failure Maternal Grandfather     Social History   Tobacco Use  . Smoking status: Former Research scientist (life sciences)  . Smokeless tobacco: Never Used  Substance Use Topics  . Alcohol use: Never  . Drug use: Yes    Types: Cocaine, Marijuana    Home Medications Prior to Admission medications   Medication Sig Start Date End Date Taking? Authorizing Provider  ALPRAZolam Duanne Moron) 1 MG tablet Take 1 mg by mouth every 4 (four) hours.    Yes [provider]  aspirin EC 81 MG tablet Take 81 mg by mouth daily as needed for moderate pain.   Yes [provider]  cyclobenzaprine (FLEXERIL) 5 MG tablet Take 5 mg by mouth daily as needed for muscle spasms.  05/11/18  Yes [provider]  Multiple Vitamin (MULTIVITAMIN) tablet Take 1 tablet by mouth daily.   Yes [provider]  omeprazole (PRILOSEC) 20 MG capsule Take 20 mg by mouth daily as needed (indigestion).  08/31/19  Yes [provider]  sildenafil (VIAGRA) 100 MG tablet Take 100 mg by mouth daily as needed for erectile dysfunction. 09/24/19  Yes [provider]  SPRAVATO, 84 MG DOSE, 28 MG/DEVICE SOPK Inhale 84 mg into the lungs 2 (two) times a week. Mon and Wed 09/24/19  Yes [provider]  zolpidem (AMBIEN) 10 MG tablet Take 10 mg by mouth at bedtime. 09/24/19  Yes [provider]  aspirin EC 81 MG tablet Take 1 tablet (81 mg total) by mouth daily. Patient not taking: Reported on 10/16/2019 10/25/13   Nahser, Wonda Cheng, MD  metoprolol tartrate (LOPRESSOR) 50 MG tablet 1 TABLET  1 HOUR PRIOR TO CT Patient not taking: Reported on 09/28/2019 05/17/18   Skeet Latch, MD    Allergies    Celexa [citalopram hydrobromide], Effexor [venlafaxine], Lexapro [escitalopram oxalate], Paxil [paroxetine hcl], and Zoloft [sertraline hcl]  Review of Systems   Review of Systems  Constitutional: Negative for chills and fever.  HENT: Negative.   Eyes: Negative for visual disturbance.  Respiratory: Negative for cough and shortness of breath.   Cardiovascular: Negative for chest pain.  Gastrointestinal: Negative for abdominal pain, nausea and vomiting.  Genitourinary: Negative for dysuria and frequency.  Musculoskeletal: Negative for arthralgias, myalgias, neck pain and neck stiffness.  Neurological: Positive for headaches. Negative for dizziness, seizures, syncope, facial asymmetry, speech difficulty, weakness, light-headedness and numbness.  Psychiatric/Behavioral: Positive for confusion. The patient is nervous/anxious.     Physical Exam Updated Vital Signs BP (!) 151/106 (BP Location: Left Arm)   Temp 98.5 F (36.9 C) (Oral)   Resp 17   Ht 5' 10.5" (1.791 m)   Wt 97.5 kg   SpO2 96%   BMI 30.41 kg/m   Physical Exam Vitals and nursing note reviewed.  Constitutional:      General: He is not in acute distress.    Appearance: He is well-developed. He is not diaphoretic.     Comments: Patient appears anxious but is in no distress  HENT:     Head: Normocephalic and atraumatic.     Mouth/Throat:     Mouth: Mucous membranes are moist.     Pharynx: Oropharynx is clear.  Eyes:     General:        Right eye: No discharge.        Left eye: No discharge.     Extraocular Movements: Extraocular movements intact.     Pupils: Pupils are equal, round, and reactive to light.  Cardiovascular:     Rate and Rhythm: Normal rate and regular rhythm.     Heart sounds: Normal heart sounds.  Pulmonary:     Effort: Pulmonary effort is normal. No respiratory distress.     Breath  sounds: Normal breath sounds. No wheezing or rales.     Comments: Respirations equal and unlabored, patient able to speak in full sentences, lungs clear to auscultation bilaterally Abdominal:     General: Bowel sounds are normal. There is no distension.     Palpations: Abdomen is soft. There is no mass.     Tenderness: There is no abdominal tenderness. There is no guarding.     Comments: Abdomen soft, nondistended, nontender to palpation in all quadrants without guarding or peritoneal signs  Musculoskeletal:  General: No deformity.     Cervical back: Normal range of motion and neck supple.  Skin:    General: Skin is warm and dry.     Capillary Refill: Capillary refill takes less than 2 seconds.  Neurological:     Mental Status: He is alert.     Coordination: Coordination normal.     Comments: Speech is clear, able to follow commands, responses to questions are somewhat slowed but patient is alert and oriented. CN III-XII intact Normal strength in upper and lower extremities bilaterally including dorsiflexion and plantar flexion, strong and equal grip strength Sensation normal to light and sharp touch Moves extremities without ataxia, coordination intact No pronator drift.   Psychiatric:        Mood and Affect: Mood is anxious.        Behavior: Behavior normal.     ED Results / Procedures / Treatments   Labs (all labs ordered are listed, but only abnormal results are displayed) Labs Reviewed  CBC - Abnormal; Notable for the following components:      Result Value   Hemoglobin 17.1 (*)    All other components within normal limits  COMPREHENSIVE METABOLIC PANEL - Abnormal; Notable for the following components:   Glucose, Bld 108 (*)    Total Bilirubin 1.5 (*)    All other components within normal limits  RAPID URINE DRUG SCREEN, HOSP PERFORMED - Abnormal; Notable for the following components:   Benzodiazepines POSITIVE (*)    Tetrahydrocannabinol POSITIVE (*)    All  other components within normal limits  URINALYSIS, ROUTINE W REFLEX MICROSCOPIC - Abnormal; Notable for the following components:   APPearance CLOUDY (*)    All other components within normal limits  I-STAT CHEM 8, ED - Abnormal; Notable for the following components:   BUN 21 (*)    Glucose, Bld 109 (*)    All other components within normal limits  ETHANOL  PROTIME-INR  APTT  DIFFERENTIAL    EKG None  Radiology CT Angio Head W or Wo Contrast  Result Date: 10/16/2019 CLINICAL DATA:  Altered mental status with headache EXAM: CT ANGIOGRAPHY HEAD AND NECK TECHNIQUE: Multidetector CT imaging of the head and neck was performed using the standard protocol during bolus administration of intravenous contrast. Multiplanar CT image reconstructions and MIPs were obtained to evaluate the vascular anatomy. Carotid stenosis measurements (when applicable) are obtained utilizing NASCET criteria, using the distal internal carotid diameter as the denominator. CONTRAST:  172mL OMNIPAQUE IOHEXOL 350 MG/ML SOLN COMPARISON:  None. FINDINGS: CT HEAD Brain: There is no acute intracranial hemorrhage, mass effect, or edema. Gray-white differentiation is preserved. Ventricles and sulci are normal in size and configuration. There is no extra-axial collection. Vascular: No hyperdense vessel or unexpected calcification. Skull: Unremarkable. Sinuses: Aerated Orbits: Unremarkable. Review of the MIP images confirms the above findings CTA NECK FINDINGS Aortic arch: Great vessel origins are partially obscured by artifact but appear patent. Right carotid system: Patent.  There is no measurable stenosis. Left carotid system: Patent.  There is no measurable stenosis. Vertebral arteries: Patent. Left vertebral artery is dominant with congenitally small right artery. Skeleton: Degenerative changes of the cervical spine. Other neck: No neck mass or adenopathy. Upper chest: No apical lung mass. Review of the MIP images confirms the above  findings CTA HEAD FINDINGS Anterior circulation: Intracranial internal carotid arteries are patent. Anterior and middle cerebral arteries are patent. Posterior circulation: Right vertebral artery becomes even more diminutive after origin of PICA. Intracranial left vertebral  artery is patent. Basilar artery is patent. Posterior cerebral arteries are patent. A right posterior communicating artery is identified. Venous sinuses: As permitted by contrast timing, patent. Review of the MIP images confirms the above findings IMPRESSION: No acute intracranial abnormality. No large vessel occlusion, hemodynamically significant stenosis, or evidence of dissection Electronically Signed   By: Macy Mis M.D.   On: 10/16/2019 16:20   CT Angio Neck W and/or Wo Contrast  Result Date: 10/16/2019 CLINICAL DATA:  Altered mental status with headache EXAM: CT ANGIOGRAPHY HEAD AND NECK TECHNIQUE: Multidetector CT imaging of the head and neck was performed using the standard protocol during bolus administration of intravenous contrast. Multiplanar CT image reconstructions and MIPs were obtained to evaluate the vascular anatomy. Carotid stenosis measurements (when applicable) are obtained utilizing NASCET criteria, using the distal internal carotid diameter as the denominator. CONTRAST:  167mL OMNIPAQUE IOHEXOL 350 MG/ML SOLN COMPARISON:  None. FINDINGS: CT HEAD Brain: There is no acute intracranial hemorrhage, mass effect, or edema. Gray-white differentiation is preserved. Ventricles and sulci are normal in size and configuration. There is no extra-axial collection. Vascular: No hyperdense vessel or unexpected calcification. Skull: Unremarkable. Sinuses: Aerated Orbits: Unremarkable. Review of the MIP images confirms the above findings CTA NECK FINDINGS Aortic arch: Great vessel origins are partially obscured by artifact but appear patent. Right carotid system: Patent.  There is no measurable stenosis. Left carotid system: Patent.   There is no measurable stenosis. Vertebral arteries: Patent. Left vertebral artery is dominant with congenitally small right artery. Skeleton: Degenerative changes of the cervical spine. Other neck: No neck mass or adenopathy. Upper chest: No apical lung mass. Review of the MIP images confirms the above findings CTA HEAD FINDINGS Anterior circulation: Intracranial internal carotid arteries are patent. Anterior and middle cerebral arteries are patent. Posterior circulation: Right vertebral artery becomes even more diminutive after origin of PICA. Intracranial left vertebral artery is patent. Basilar artery is patent. Posterior cerebral arteries are patent. A right posterior communicating artery is identified. Venous sinuses: As permitted by contrast timing, patent. Review of the MIP images confirms the above findings IMPRESSION: No acute intracranial abnormality. No large vessel occlusion, hemodynamically significant stenosis, or evidence of dissection Electronically Signed   By: Macy Mis M.D.   On: 10/16/2019 16:20    Procedures Procedures (including critical care time)  Medications Ordered in ED Medications  sodium chloride (PF) 0.9 % injection (has no administration in time range)  iohexol (OMNIPAQUE) 350 MG/ML injection 100 mL (100 mLs Intravenous Contrast Given 10/16/19 1547)  sodium chloride 0.9 % bolus 1,000 mL (1,000 mLs Intravenous New Bag/Given 10/16/19 1649)  prochlorperazine (COMPAZINE) injection 10 mg (10 mg Intravenous Given 10/16/19 1646)  diphenhydrAMINE (BENADRYL) injection 25 mg (25 mg Intravenous Given 10/16/19 1646)    ED Course  I have reviewed the triage vital signs and the nursing notes.  Pertinent labs & imaging results that were available during my care of the patient were reviewed by me and considered in my medical decision making (see chart for details).    MDM Rules/Calculators/A&P                      54 year old male presents with intermittent frontal headache  and confusion over the past 4 days.  On arrival patient is well-appearing with normal vitals and with no focal neurologic deficits, although his verbal responses do seem somewhat delayed, he is alert and able to answer all of my questions.  Question whether this could be  TIA, stroke, intracranial bleed, versus complex migraine.  Will get labs and CTA of the head and neck.  Patient's lab evaluation is overall reassuring, no leukocytosis, hemoglobin slightly elevated, no significant electrolyte derangements, normal renal and liver function.  Urinalysis without signs of infection.  Negative ethanol level, UDS positive for benzos which patient is prescribed, as well as THC.  CTA of the head and neck is unremarkable.  Question whether this could be complex migraine versus stroke.  Last known well time was 5 PM on Friday.  Will discuss with neurology, patient will likely need MRI.  Case discussed with Dr. Rory Percy with neurology who recommends MRI, will also give patient migraine cocktail.  Discussed plan with patient who is in agreement.  Dr. Roderic Palau has seen and evaluated the patient as well.  At shift change MRI is pending, care signed out to PA Charmaine Downs will follow up on MRI and touch base with neurology, if normal suspect patient can be discharged home.  Final Clinical Impression(s) / ED Diagnoses Final diagnoses:  Frontal headache  Confusion    Rx / DC Orders ED Discharge Orders    None       Janet Berlin 10/16/19 2133    Milton Ferguson, MD 10/18/19 [phone]

## 2019-10-16 NOTE — ED Provider Notes (Signed)
Care assumed from [facility], PA-C at shift change.   HPI from previous provider below:  "Craig [name] is a 54 y.o. male with history of bipolar disorder, and generalized anxiety who presents to the emergency department for confusion and headache.  Patient states that Friday evening around 5 PM he started out at work.  He states that a few hours later he developed a sudden onset frontal headache that lasted throughout the night.  He states that he was trying to watch TV with his wife could not read the text on the TV, because it was blurry.  He went to bed and his vision seemed to be normal when he woke up and his headache had resolved but he reports throughout the weekend he continued to feel all, like he was mentally slowed and could not remember things or respond at his usual case.  He denies experiencing any facial droop, any additional vision changes, no numbness, tingling or weakness in his arms or legs.  His wife did take him at one point that some of his responses were not making sense.  He states that this morning he woke up and again had a mild headache called on him swelling was more gradual in onset and continued to feel some intermittent confusion.  He called his primary doctor who recommended he come in for further evaluation.  He denies any history of similar symptoms.  He reports he has history of anxiety and bipolar disorder but is otherwise healthy.  Reports marijuana use but denies other drug use, as prescribed Xanax.  Patient does state that he is currently feeling anxious."  Plan from previous provider: follow-up with MRI. If MRI is normal, touch base with neurology prior to discharge.   I personally evaluated patient at bedside. No new complaints. Neurological exam normal. Patient denies history of A. Fib. MRI personally reviewed which is negative for any acute abnormalities. Given patient's normal CTA head and neck and otherwise reassuring work-up do not feel any further workup  is warranted at this time. No concern for CVA. Suspect symptoms could be related to a complicated migraine. Ambulatory referral for neurology placed for patient at discharge. Instructed patient to return to the ED if he experiences another episode. Discussed case with Dr. Johnney Killian (last provider's attending) who feels patient is safe for discharge. Strict ED precautions discussed with patient. Patient states understanding and agrees to plan. Patient discharged home in no acute distress and stable vitals    Craig [name] 10/17/19 [id number]    [name], Craig Allegra, MD 10/17/19 1630

## 2019-10-17 NOTE — Progress Notes (Signed)
[facility]    Provider:  Dr Jaynee Eagles Requesting Provider: Gerilyn Pilgrim Primary Care Provider:  Gaynelle Arabian, MD  CC:  Headache resolved  HPI:  Craig [name] is a 54 y.o. male here as requested by Karlton Lemon for frontal headache.  Referral was placed yesterday and I am seeing him today for urgent referral.  Past medical history anxiety, cocaine use, bipolar affective disorder.  I reviewed emergency room notes, patient presented to the emergency department for confusion and headaches, several days ago he developed a sudden onset frontal headache that lasted throughout the night, vision changes, he went to bed in his vision appeared to be normal when he woke up and his headache had resolved but throughout the weekend he continued to feel ill like he was mentally slowed and cannot remember or respond as he usually did, no facial droop or focal weakness or sensory changes.  He woke up with a mild headache and proceeded to the emergency room, reports marijuana use and prescribed Xanax.  Reported anxiety.  Blood pressure was 151/106, normal temp, I reviewed examination patient appeared anxious but in no distress, physical exam was normal, neurologic exam was nonfocal.  He was given fluid, Compazine, Benadryl.  Normal vitals.  Lab work was unremarkable, CTA of the head and neck unremarkable.  MRI was ordered which was unremarkable with nothing acute.  No concern for CVA on work-up, possibly migraine.  6 days ago in the evening(Friday) couldn't remember password then he was watching TV and he developed a headache, he had blurry vision looking at the TV, he remembers having a few phonecalls doesn't remember the conversations, he took 5 ibuprofen 2x that evening, in the frontal bilaterally, 5-6/10, wouldn't go away, usually doesn't get headaches. He told his wife he wasn't feeling well, went to bed and slept fine, he was fine Saturday morning but Sunday he sent wife a text said  he was feeling "weird" and he doesn't really remember the text, he had forgot about it, he smoked marijuana Friday evening, but nothing Sunday.  He went home and everything was fine later no headache, on Tuesday he went to the emergency room because his mo said she had a "mini stroke" he was not symptomatic at the time and he was told by Dr. Marisue Humble to go to the ED. It was really Friday evening that was cncerning. He was told he may have sleep apnea in the hospital and he kept stopping breating and he was told he likely has sleep apnea.   Reviewed notes, labs and imaging from outside physicians, which showed   MRI brain 10/16/2019 showed No acute intracranial abnormalities including mass lesion or mass effect, hydrocephalus, extra-axial fluid collection, midline shift, hemorrhage, or acute infarction, large ischemic events (personally reviewed images)  CTA head and neck: revuewed report: No acute intracranial abnormality. No large vessel occlusion,hemodynamically significant stenosis, or evidence of dissection   Review of Systems: Patient complains of symptoms per HPI as well as the following symptoms disabled. Pertinent negatives and positives per HPI. All others negative.   Social History   Socioeconomic History  . Marital status: Married    Spouse name: Not on file  . Number of children: Not on file  . Years of education: Not on file  . Highest education level: Not on file  Occupational History  . Not on file  Tobacco Use  . Smoking status: Former Research scientist (life sciences)  . Smokeless tobacco: Never Used  Substance and Sexual Activity  .  Alcohol use: Never    Comment: quit 2003   . Drug use: Yes    Types: Cocaine, Marijuana    Comment: quit cocaine 2003; smokes weed sometimes now  . Sexual activity: Yes  Other Topics Concern  . Not on file  Social History Narrative   Lives at home with wife Sharyn Lull    Right handed   Caffeine: sugar free Rip-it energy drink in the mornings   Social  Determinants of Health   Financial Resource Strain:   . Difficulty of Paying Living Expenses: Not on file  Food Insecurity:   . Worried About Charity fundraiser in the Last Year: Not on file  . Ran Out of Food in the Last Year: Not on file  Transportation Needs:   . Lack of Transportation (Medical): Not on file  . Lack of Transportation (Non-Medical): Not on file  Physical Activity:   . Days of Exercise per Week: Not on file  . Minutes of Exercise per Session: Not on file  Stress:   . Feeling of Stress : Not on file  Social Connections:   . Frequency of Communication with Friends and Family: Not on file  . Frequency of Social Gatherings with Friends and Family: Not on file  . Attends Religious Services: Not on file  . Active Member of Clubs or Organizations: Not on file  . Attends Archivist Meetings: Not on file  . Marital Status: Not on file  Intimate Partner Violence:   . Fear of Current or Ex-Partner: Not on file  . Emotionally Abused: Not on file  . Physically Abused: Not on file  . Sexually Abused: Not on file    Family History  Problem Relation Age of Onset  . CAD Father   . Heart attack Mother   . Stroke Mother   . Lupus Mother   . Heart disease Maternal Grandfather   . Heart failure Maternal Grandfather     Past Medical History:  Diagnosis Date  . Bipolar affective disorder ([place])   . Cocaine use    QUIT IN 2003  . GAD (generalized anxiety disorder)     Patient Active Problem List   Diagnosis Date Noted  . TIA (transient ischemic attack) 10/18/2019  . Fatigue 10/23/2013  . Weakness 10/23/2013  . Chest pain 10/23/2013  . Chest congestion 10/23/2013    Past Surgical History:  Procedure Laterality Date  . BACK SURGERY    . KNEE SURGERY     x2  . LEFT BICEPS TEAR  05/2011   x2  . [place] SURGERY  1999  . right bicep tendon repair    . RIGHT HAND FRACTURE  2011    Current Outpatient Medications  Medication Sig Dispense Refill  .  ALPRAZolam (XANAX) 1 MG tablet Take 1 mg by mouth every 4 (four) hours.     Marland Kitchen aspirin EC 81 MG tablet Take 1 tablet (81 mg total) by mouth daily. 90 tablet 3  . aspirin EC 81 MG tablet Take 81 mg by mouth daily as needed for moderate pain.    . cyclobenzaprine (FLEXERIL) 5 MG tablet Take 5 mg by mouth daily as needed for muscle spasms.   0  . Multiple Vitamin (MULTIVITAMIN) tablet Take 1 tablet by mouth daily.    Marland Kitchen omeprazole (PRILOSEC) 20 MG capsule Take 20 mg by mouth daily as needed (indigestion).     . sildenafil (VIAGRA) 100 MG tablet Take 100 mg by mouth daily as needed  for erectile dysfunction.    Marland Kitchen SPRAVATO, 84 MG DOSE, 28 MG/DEVICE SOPK Inhale 84 mg into the lungs 2 (two) times a week. Mon and Wed    . zolpidem (AMBIEN) 10 MG tablet Take 10 mg by mouth at bedtime.     No current facility-administered medications for this visit.    Allergies as of 10/18/2019 - Review Complete 10/18/2019  Allergen Reaction Noted  . Celexa [citalopram hydrobromide]  10/23/2013  . Effexor [venlafaxine]  10/23/2013  . Lexapro [escitalopram oxalate]  10/23/2013  . Paxil [paroxetine hcl]  10/23/2013  . Zoloft [sertraline hcl]  10/23/2013    Vitals: BP 118/80 (BP Location: Right Arm, Patient Position: Sitting)   Pulse 92   Temp (!) 97.3 F (36.3 C)   Ht 5\' 10"  (1.778 m)   Wt 216 lb (98 kg)   BMI 30.99 kg/m  Last Weight:  Wt Readings from Last 1 Encounters:  10/18/19 216 lb (98 kg)   Last Height:   Ht Readings from Last 1 Encounters:  10/18/19 5\' 10"  (1.778 m)     Physical exam: Exam: Gen: NAD, conversant, well nourised           CV: RRR, no MRG. No Carotid Bruits. No peripheral edema, warm, nontender Eyes: Conjunctivae clear without exudates or hemorrhage  Neuro: Detailed Neurologic Exam  Speech:    Speech is normal; fluent and spontaneous with normal comprehension.  Cognition:    The patient is oriented to person, place, and time;     recent and remote memory intact;      language fluent;     normal attention, concentration,     fund of knowledge Cranial Nerves:    The pupils are equal, round, and reactive to light.attempted fundoscopic exam could not visualize. Visual fields are full to finger confrontation. Extraocular movements are intact. Trigeminal sensation is intact and the muscles of mastication are normal. The face is symmetric. The palate elevates in the midline. Hearing intact. Voice is normal. Shoulder shrug is normal. The tongue has normal motion without fasciculations.   Coordination:    No dysmetria  Gait:    Normal native gait  Motor Observation:    No asymmetry, no atrophy, and no involuntary movements noted. Tone:    Normal muscle tone.    Posture:    Posture is normal. normal erect    Strength:    Strength is V/V in the upper and lower limbs.      Sensation: intact to LT     Reflex Exam:  DTR's:    Deep tendon reflexes in the upper and lower extremities are symmetrical bilaterally.   Toes:    The toes are equiv bilaterally.   Clonus:    Clonus is absent.    Assessment/Plan:  54 y.o. male here as requested by Karlton Lemon for frontal headache, episode confusion and acute memory loss, possible TIA.  Referral was placed yesterday and I am seeing him today for urgent referral.  Past medical history anxiety, cocaine use, bipolar affective disorder. MRI brain/ CTA head/neck in the ED neg for acute etiology. Needs to complete stroke evaluation, also very high suspicion for sleep apnea.No hx of migraines or headaches.   Witnessed apneic events while sleeping the hospital and wife noticed, excessive daytime somnolence, TIA: needs sleep evaluation  He has not been taking ASA regularly, recommend daily ASA  I had a long d/w patient about TIA, risk for recurrent stroke/TIAs, personally independently reviewed imaging studies and stroke evaluation results  and answered questions. ASA for secondary stroke prevention and maintain strict  control of hypertension with blood pressure goal below 130/90, diabetes with hemoglobin A1c goal below 6.5% and lipids with LDL cholesterol goal below 70 mg/dL.I also advised the patient to eat a healthy diet with plenty of whole grains, cereals, fruits and vegetables, exercise regularly and maintain ideal body weight.  Orders Placed This Encounter  Procedures  . Lipid panel  . Hemoglobin A1c  . Ambulatory referral to Sleep Studies  . ECHOCARDIOGRAM COMPLETE BUBBLE STUDY     Cc: Gaynelle Arabian, MD,    Sarina Ill, MD  [facility] [address] [place], [place] [address]  Phone [phone] Fax [phone]

## 2019-10-17 NOTE — Discharge Instructions (Signed)
As discussed your CTA and MRI were normal. I have made a request for a neurology appointment for you. You should hear from them within the next week. If you do not hear from them in a week, please call the number provided to make an appointment. Return to the ER if you have another episode.

## 2019-10-18 ENCOUNTER — Ambulatory Visit (INDEPENDENT_AMBULATORY_CARE_PROVIDER_SITE_OTHER): Payer: Managed Care, Other (non HMO) | Admitting: Neurology

## 2019-10-18 ENCOUNTER — Encounter: Payer: Self-pay | Admitting: Neurology

## 2019-10-18 ENCOUNTER — Other Ambulatory Visit: Payer: Self-pay

## 2019-10-18 VITALS — BP 118/80 | HR 92 | Temp 97.3°F | Ht 70.0 in | Wt 216.0 lb

## 2019-10-18 DIAGNOSIS — G459 Transient cerebral ischemic attack, unspecified: Secondary | ICD-10-CM | POA: Diagnosis not present

## 2019-10-18 DIAGNOSIS — G4719 Other hypersomnia: Secondary | ICD-10-CM

## 2019-10-18 DIAGNOSIS — R0681 Apnea, not elsewhere classified: Secondary | ICD-10-CM | POA: Diagnosis not present

## 2019-10-18 DIAGNOSIS — Z131 Encounter for screening for diabetes mellitus: Secondary | ICD-10-CM

## 2019-10-18 DIAGNOSIS — R079 Chest pain, unspecified: Secondary | ICD-10-CM | POA: Diagnosis not present

## 2019-10-18 NOTE — Patient Instructions (Signed)
Blood work Echocardiogram heart Sleep evaluation with Dr. Brett Fairy   Transient Ischemic Attack  A transient ischemic attack (TIA) is a "warning stroke" that causes stroke-like symptoms that go away quickly. A TIA does not cause lasting damage to the brain. But having a TIA is a sign that you may be at risk for a stroke. Lifestyle changes and medical treatments can help prevent a stroke. It is important to know the symptoms of a TIA and what to do. Get help right away, even if your symptoms go away. The symptoms of a TIA are the same as those of a stroke. They can happen fast, and they usually go away within minutes or hours. They can include:  Weakness or loss of feeling in your face, arm, or leg. This often happens on one side of your body.  Trouble walking.  Trouble moving your arms or legs.  Trouble talking or understanding what people are saying.  Trouble seeing.  Seeing two of one object (double vision).  Feeling dizzy.  Feeling confused.  Loss of balance or coordination.  Feeling sick to your stomach (nauseous) and throwing up (vomiting).  A very bad headache for no reason. What increases the risk? Certain things may make you more likely to have a TIA. Some of these are things that you can change, such as:  Being very overweight (obese).  Using products that contain nicotine or tobacco, such as cigarettes and e-cigarettes.  Taking birth control pills.  Not being active.  Drinking too much alcohol.  Using drugs. Other risk factors include:  Having an irregular heartbeat (atrial fibrillation).  Being African American or Hispanic.  Having had blood clots, stroke, TIA, or heart attack in the past.  Being a woman with a history of high blood pressure in pregnancy (preeclampsia).  Being over the age of 92.  Being male.  Having family history of stroke.  Having the following diseases or conditions: ? High blood pressure. ? High  cholesterol. ? Diabetes. ? Heart disease. ? Sickle cell disease. ? Sleep apnea. ? Migraine headache. ? Long-term (chronic) diseases that cause soreness and swelling (inflammation). ? Disorders that affect how your blood clots. Follow these instructions at home: Medicines   Take over-the-counter and prescription medicines only as told by your doctor.  If you were told to take aspirin or another medicine to thin your blood, take it exactly as told by your doctor. ? Taking too much of the medicine can cause bleeding. ? Taking too little of the medicine may not work to treat the problem. Eating and drinking   Eat 5 or more servings of fruits and vegetables each day.  Follow instructions from your doctor about your diet. You may need to follow a certain diet to help lower your risk of having a stroke. You may need to: ? Eat a diet that is low in fat and salt. ? Eat foods that contain a lot of fiber. ? Limit the amount of carbohydrates and sugar in your diet.  Limit alcohol intake to 1 drink a day for nonpregnant women and 2 drinks a day for men. One drink equals 12 oz of beer, 5 oz of wine, or 1 oz of hard liquor. General instructions  Keep a healthy weight.  Stay active. Try to get at least 30 minutes of activity on all or most days.  Find out if you have a condition called sleep apnea. Get treatment if needed.  Do not use any products that contain nicotine or  tobacco, such as cigarettes and e-cigarettes. If you need help quitting, ask your doctor.  Do not abuse drugs.  Keep all follow-up visits as told by your doctor. This is important. Get help right away if:  You have any signs of stroke. "BE FAST" is an easy way to remember the main warning signs: ? B - Balance. Signs are dizziness, sudden trouble walking, or loss of balance. ? E - Eyes. Signs are trouble seeing or a sudden change in how you see. ? F - Face. Signs are sudden weakness or loss of feeling of the face, or  the face or eyelid drooping on one side. ? A - Arms. Signs are weakness or loss of feeling in an arm. This happens suddenly and usually on one side of the body. ? S - Speech. Signs are sudden trouble speaking, slurred speech, or trouble understanding what people say. ? T - Time. Time to call emergency services. Write down what time symptoms started.  You have other signs of stroke, such as: ? A sudden, very bad headache with no known cause. ? Feeling sick to your stomach (nausea). ? Throwing up (vomiting). ? Jerky movements that you cannot control (seizure). These symptoms may be an emergency. Do not wait to see if the symptoms will go away. Get medical help right away. Call your local emergency services (911 in the [place] Do not drive yourself to the hospital. Summary  A transient ischemic attack (TIA) is a "warning stroke" that causes stroke-like symptoms that go away quickly.  A TIA is a medical emergency. Get help right away, even if your symptoms go away.  A TIA does not cause lasting damage to the brain.  Having a TIA is a sign that you may be at risk for a stroke. Lifestyle changes and medical treatments can help prevent a stroke. This information is not intended to replace advice given to you by your health care provider. Make sure you discuss any questions you have with your health care provider. Document Revised: 06/02/2018 Document Reviewed: 12/08/2016 Elsevier Patient Education  Craig [name].

## 2019-10-19 LAB — HEMOGLOBIN A1C
Est. average glucose Bld gHb Est-mCnc: 103 mg/dL
Hgb A1c MFr Bld: 5.2 % (ref 4.8–5.6)

## 2019-10-19 LAB — LIPID PANEL
Chol/HDL Ratio: 5.3 ratio — ABNORMAL HIGH (ref 0.0–5.0)
Cholesterol, Total: 174 mg/dL (ref 100–199)
HDL: 33 mg/dL — ABNORMAL LOW (ref >39)
LDL Chol Calc (NIH): 126 mg/dL — ABNORMAL HIGH (ref 0–99)
Triglycerides: 78 mg/dL (ref 0–149)
VLDL Cholesterol Cal: 15 mg/dL (ref 5–40)

## 2019-10-19 NOTE — Progress Notes (Signed)
Craig [name], LDL is  bad cholesterol and is126 which is elevated (we want it less than 70 in someone with a possible TIA ("mini-stroke" as patient referred to it). Also his HDL which is good cholesterol is low. I would consider starting a statin if patient agrees otherwise he can follow up with Dr. Marisue Humble who I have CCed on this resut note thanks. If he agrees I would start Lipitor 20mg . Thanks. Dr. Jaynee Eagles

## 2019-10-25 ENCOUNTER — Encounter: Payer: Self-pay | Admitting: Neurology

## 2019-10-25 ENCOUNTER — Telehealth: Payer: Self-pay | Admitting: *Deleted

## 2019-10-25 ENCOUNTER — Other Ambulatory Visit: Payer: Self-pay

## 2019-10-25 ENCOUNTER — Ambulatory Visit (INDEPENDENT_AMBULATORY_CARE_PROVIDER_SITE_OTHER): Payer: Managed Care, Other (non HMO) | Admitting: Neurology

## 2019-10-25 VITALS — BP 133/75 | HR 87 | Temp 98.2°F | Ht 70.5 in | Wt 207.0 lb

## 2019-10-25 DIAGNOSIS — R079 Chest pain, unspecified: Secondary | ICD-10-CM | POA: Diagnosis not present

## 2019-10-25 DIAGNOSIS — G43109 Migraine with aura, not intractable, without status migrainosus: Secondary | ICD-10-CM | POA: Diagnosis not present

## 2019-10-25 DIAGNOSIS — G4719 Other hypersomnia: Secondary | ICD-10-CM

## 2019-10-25 DIAGNOSIS — G459 Transient cerebral ischemic attack, unspecified: Secondary | ICD-10-CM

## 2019-10-25 DIAGNOSIS — R0681 Apnea, not elsewhere classified: Secondary | ICD-10-CM | POA: Insufficient documentation

## 2019-10-25 DIAGNOSIS — J300 Vasomotor rhinitis: Secondary | ICD-10-CM

## 2019-10-25 NOTE — Telephone Encounter (Signed)
I spoke with the patient and discussed the results. He is agreeable to starting a statin, Lipitor 20 mg. I discussed the possible common side effects and let him know although rare, if he experiences and darkened urine or yellowing of the eyes and skin to contact the doctor immediately. He verbalized understanding.

## 2019-10-25 NOTE — Patient Instructions (Signed)
Hypersomnia Hypersomnia is a condition in which a person feels very tired during the day even though he or she gets plenty of sleep at night. A person with this condition may take naps during the day and may find it very difficult to wake up from sleep. Hypersomnia may affect a person's ability to think, concentrate, drive, or remember things. What are the causes? The cause of this condition may not be known. Possible causes include:  Certain medicines.  Sleep disorders, such as narcolepsy and sleep apnea.  Injury to the head, brain, or spinal cord.  Drug or alcohol use.  Gastroesophageal reflux disease (GERD).  Tumors.  Certain medical conditions, such as depression, diabetes, or an underactive thyroid gland (hypothyroidism). What are the signs or symptoms? The main symptoms of hypersomnia include:  Feeling very tired throughout the day, regardless of how much sleep you got the night before.  Having trouble waking up. Others may find it difficult to wake you up when you are sleeping.  Sleeping for longer and longer periods at a time.  Taking naps throughout the day. Other symptoms may include:  Feeling restless, anxious, or annoyed.  Lacking energy.  Having trouble with: ? Remembering. ? Speaking. ? Thinking.  Loss of appetite.  Seeing, hearing, tasting, smelling, or feeling things that are not real (hallucinations). How is this diagnosed? This condition may be diagnosed based on:  Your symptoms and medical history.  Your sleeping habits. Your health care provider may ask you to write down your sleeping habits in a daily sleep log, along with any symptoms you have.  A series of tests that are done while you sleep (sleep study or polysomnogram).  A test that measures how quickly you can fall asleep during the day (daytime nap study or multiple sleep latency test). How is this treated? Treatment can help you manage your condition. Treatment may  include:  Following a regular sleep routine.  Lifestyle changes, such as changing your eating habits, getting regular exercise, and avoiding alcohol or caffeinated beverages.  Taking medicines to make you more alert (stimulants) during the day.  Treating any underlying medical causes of hypersomnia. Follow these instructions at home: Sleep routine   Schedule the same bedtime and wake-up time each day.  Practice a relaxing bedtime routine. This may include reading, meditation, deep breathing, or taking a warm bath before going to sleep.  Get regular exercise each day. Avoid strenuous exercise in the evening hours.  Keep your sleep environment at a cooler temperature, darkened, and quiet.  Sleep with pillows and a mattress that are comfortable and supportive.  Schedule short 20-minute naps for when you feel sleepiest during the day.  Talk with your employer or teachers about your hypersomnia. If possible, adjust your schedule so that: ? You have a regular daytime work schedule. ? You can take a scheduled nap during the day. ? You do not have to work or be active at night.  Do not eat a heavy meal for a few hours before bedtime. Eat your meals at about the same times every day.  Avoid drinking alcohol or caffeinated beverages. Safety   Do not drive or use heavy machinery if you are sleepy. Ask your health care provider if it is safe for you to drive.  Wear a life jacket when swimming or spending time near water. General instructions  Take supplements and over-the-counter and prescription medicines only as told by your health care provider.  Keep a sleep log that will help   your doctor manage your condition. This may include information about: ? What time you go to bed each night. ? How often you wake up at night. ? How many hours you sleep at night. ? How often and for how long you nap during the day. ? Any observations from others, such as leg movements during sleep,  sleep walking, or snoring.  Keep all follow-up visits as told by your health care provider. This is important. Contact a health care provider if:  You have new symptoms.  Your symptoms get worse. Get help right away if:  You have serious thoughts about hurting yourself or someone else. If you ever feel like you may hurt yourself or others, or have thoughts about taking your own life, get help right away. You can go to your nearest emergency department or call:  Your local emergency services (911 in the [place]  A suicide crisis helpline, such as the [facility] at 863-383-2551. This is open 24 hours a day. Summary  Hypersomnia refers to a condition in which you feel very tired during the day even though you get plenty of sleep at night.  A person with this condition may take naps during the day and may find it very difficult to wake up from sleep.  Hypersomnia may affect a person's ability to think, concentrate, drive, or remember things.  Treatment, such as following a regular sleep routine and making some lifestyle changes, can help you manage your condition. This information is not intended to replace advice given to you by your health care provider. Make sure you discuss any questions you have with your health care provider. Document Revised: 09/08/2017 Document Reviewed: 09/08/2017 Elsevier Patient Education  2020 [place] for Sleep Apnea  Sleep apnea is a condition in which breathing pauses or becomes shallow during sleep. Sleep apnea screening is a test to determine if you are at risk for sleep apnea. The test is easy and only takes a few minutes. Your health care provider may ask you to have this test in preparation for surgery or as part of a physical exam. What are the symptoms of sleep apnea? Common symptoms of sleep apnea include:  Snoring.  Restless sleep.  Daytime sleepiness.  Pauses in breathing.  Choking during  sleep.  Irritability.  Forgetfulness.  Trouble thinking clearly.  Depression.  Personality changes. Most people with sleep apnea are not aware that they have it. Why should I get screened? Getting screened for sleep apnea can help:  Ensure your safety. It is important for your health care providers to know whether or not you have sleep apnea, especially if you are having surgery or have other long-term (chronic) health conditions.  Improve your health and allow you to get a better night's rest. Restful sleep can help you: ? Have more energy. ? Lose weight. ? Improve high blood pressure. ? Improve diabetes management. ? Prevent stroke. ? Prevent car accidents. How is screening done? Screening usually includes being asked a list of questions about your sleep quality. Some questions you may be asked include:  Do you snore?  Is your sleep restless?  Do you have daytime sleepiness?  Has a partner or spouse told you that you stop breathing during sleep?  Have you had trouble concentrating or memory loss? If your screening test is positive, you are at risk for the condition. Further testing may be needed to confirm a diagnosis of sleep apnea. Where to find more information You can  find screening tools online or at your health care clinic. For more information about sleep apnea screening and healthy sleep, visit these websites:  Centers for Disease Control and Prevention: [employer]at[employer].pl  American Sleep Apnea Association: www.sleepapnea.org Contact a health care provider if:  You think that you may have sleep apnea. Summary  Sleep apnea screening can help determine if you are at risk for sleep apnea.  It is important for your health care providers to know whether or not you have sleep apnea, especially if you are having surgery or have other chronic health conditions.  You may be asked to take a screening test for sleep apnea in preparation for surgery or as  part of a physical exam. This information is not intended to replace advice given to you by your health care provider. Make sure you discuss any questions you have with your health care provider. Document Revised: 06/23/2018 Document Reviewed: 12/17/2016 Elsevier Patient Education  [place].

## 2019-10-25 NOTE — Telephone Encounter (Signed)
-----   Message from Melvenia Beam, MD sent at 10/19/2019  1:14 PM EST ----- Romelle Starcher, LDL is  bad cholesterol and is126 which is elevated (we want it less than 70 in someone with a possible TIA ("mini-stroke" as patient referred to it). Also his HDL which is good cholesterol is low. I would consider starting a statin if patient agrees otherwise he can follow up with Dr. Marisue Humble who I have CCed on this resut note thanks. If he agrees I would start Lipitor 20mg . Thanks. Dr. Jaynee Eagles

## 2019-10-25 NOTE — Progress Notes (Signed)
SLEEP MEDICINE CLINIC    Provider:  Larey Seat, MD  Primary Care Physician:  Gaynelle Arabian, MD [address] [place]  [address]    D Ed report: He was given fluid, Compazine, Benadryl.  Normal vitals.  Lab work was unremarkable, CTA of the head and neck unremarkable.  MRI was ordered which was unremarkable with nothing acute.  No concern for CVA on work-up, possibly migraine.  Referring Provider: Dr Jaynee Eagles, MD         Chief Complaint according to patient   Patient presents with:    . New Patient (Initial Visit)           HISTORY OF PRESENT ILLNESS:  Craig [name] is a 54 year old Caucasian male patient is seen here upon referral by Dr. Jaynee Eagles on 10/25/2019 for an evaluation of possible sleep apnea. Chief concern according to patient :  " I was recently hospitalized and the nurses told me I had apneas"   I have the pleasure of seeing Craig [name] today, a right-handed Caucasian male with a possible sleep disorder.  He  has a past medical history of Bipolar affective disorder ([place]), remote history of Cocaine use in 2003, and GAD (generalized anxiety disorder).   He was hospitalized on 09-28-2019 with atypical chest pain to the ED- and later, on 10-16-2019,  for TIA/ ministrokes, but ED Dr Sherry Ruffing wrote that the patient had frontal headaches and memory loss- couldn't remember his password, and new onset headaches-  and was followed by Dr Jaynee Eagles on 10-18-2019.  His tox screen showed benzodiazepine and cannabis.    Sleep relevant medical history: snoring, quits breathing, wife took video of him sleeping ,  documenting  Apnea.    Family medical /sleep history: No other family member with OSA, no known insomnia, no sleep walkers. Mother  has been sundowning, evident since her husband passed in 2019.    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 His widowed mother lives close and has a  large yard and garden.  Family status is remarried , with one adult daughter and a granddaughter. Pets are present - a dog. Tobacco use- formerly .  Remote ETOH use; formerly - quit 17 years ago. Caffeine intake in form of Coffee(none ) Soda( none) Tea ( none) but used AM energy drinks. Regular exercise in form of yard work.   Hobbies :gardening, flowers, herbs,       Sleep habits are as follows: The patient's dinner time is between  6.30 PM. The patient goes to bed at 10.30 PM and asleep before 11 Pm, he continues to sleep for several  hours, wakes for 1-2 bathroom breaks, the first time at 2-3 AM.  Wife reports him gasping, sometimes he wakes up snoring- and usually while watching TV - dozing sofa.  The preferred sleep position is variable - lots of aches and pains- , with the support of 2 pillows. Left torn rotator cuff, torn biceps tendons bilateral -  Dreams are reportedly rare..    6 AM is the usual rise time. The patient wakes up spontaneously 5.45 without an alarm.  He reports not feeling refreshed or restored in AM, mostly with symptoms such as dry mouth , no morning headaches 9 not a headache person ) , and high residual fatigue.  unscheduled Naps are taken frequently, lasting from 15-45 minutes and are  as refreshing as nocturnal sleep.    Review of Systems: Out of a complete 14 system review, the patient complains of only the following symptoms, and all other reviewed systems are negative.:  Fatigue, sleepiness , snoring, fragmented sleep through pain, chronic joints and neck and shoulder,knees.    How likely are you to doze in the following situations: 0 = not likely, 1 = slight chance, 2 = moderate chance, 3 = high chance   Sitting and Reading? Watching Television? Sitting inactive in a public place (theater or meeting)? As a passenger in a car for an hour without a break? Lying down in the afternoon when circumstances permit? Sitting and talking to someone? Sitting quietly  after lunch without alcohol? In a car, while stopped for a few minutes in traffic?   Total = 16/ 24 points   FSS endorsed at 38/ 63 points.   Social History   Socioeconomic History  . Marital status: Married    Spouse name: Not on file  . Number of children: Not on file  . Years of education: Not on file  . Highest education level: Not on file  Occupational History  . Not on file  Tobacco Use  . Smoking status: Former Research scientist (life sciences)  . Smokeless tobacco: Never Used  Substance and Sexual Activity  . Alcohol use: Never    Comment: quit 2003   . Drug use: Yes    Types: Cocaine, Marijuana    Comment: quit cocaine 2003; smokes weed sometimes now  . Sexual activity: Yes  Other Topics Concern  . Not on file  Social History Narrative   Lives at home with wife Sharyn Lull    Right handed   Caffeine: sugar free Rip-it energy drink in the mornings   Social Determinants of Health   Financial Resource Strain:   . Difficulty of Paying Living Expenses: Not on file  Food Insecurity:   . Worried About Charity fundraiser in the Last Year: Not on file  . Ran Out of Food in the Last Year: Not on file  Transportation Needs:   . Lack of Transportation (Medical): Not on file  . Lack of Transportation (Non-Medical): Not on file  Physical Activity:   . Days of Exercise per Week: Not on file  . Minutes of Exercise per Session: Not on file  Stress:   . Feeling of Stress : Not on file  Social Connections:   . Frequency of Communication with Friends and Family: Not on file  . Frequency of Social Gatherings with Friends and Family: Not on file  . Attends Religious Services: Not on file  . Active Member of Clubs or Organizations: Not on file  . Attends Archivist Meetings: Not on file  . Marital Status: Not on file    Family History  Problem Relation Age of Onset  . CAD Father   . Heart attack Mother   . Stroke Mother   . Lupus Mother   . Heart disease Maternal Grandfather   . Heart  failure Maternal Grandfather     Past Medical History:  Diagnosis Date  . Bipolar affective disorder ([place])   . Cocaine use    QUIT IN 2003  . GAD (generalized anxiety disorder)     Past Surgical History:  Procedure Laterality Date  . BACK SURGERY    . KNEE SURGERY     x2  . LEFT BICEPS TEAR  05/2011   x2  . [place] SURGERY  1999  .  right bicep tendon repair    . RIGHT HAND FRACTURE  2011     Current Outpatient Medications on File Prior to Visit  Medication Sig Dispense Refill  . ALPRAZolam (XANAX) 1 MG tablet Take 1 mg by mouth every 4 (four) hours.     Marland Kitchen aspirin EC 81 MG tablet Take 1 tablet (81 mg total) by mouth daily. 90 tablet 3  . aspirin EC 81 MG tablet Take 81 mg by mouth daily as needed for moderate pain.    . cyclobenzaprine (FLEXERIL) 5 MG tablet Take 5 mg by mouth daily as needed for muscle spasms.   0  . Multiple Vitamin (MULTIVITAMIN) tablet Take 1 tablet by mouth daily.    Marland Kitchen omeprazole (PRILOSEC) 20 MG capsule Take 20 mg by mouth daily as needed (indigestion).     . sildenafil (VIAGRA) 100 MG tablet Take 100 mg by mouth daily as needed for erectile dysfunction.    Marland Kitchen SPRAVATO, 84 MG DOSE, 28 MG/DEVICE SOPK Inhale 84 mg into the lungs 2 (two) times a week. Mon and Wed    . zolpidem (AMBIEN) 10 MG tablet Take 10 mg by mouth at bedtime.     No current facility-administered medications on file prior to visit.    Allergies  Allergen Reactions  . Celexa [Citalopram Hydrobromide]   . Effexor [Venlafaxine]   . Lexapro [Escitalopram Oxalate]     AGITATED   . Paxil [Paroxetine Hcl]     AGITATED   . Zoloft [Sertraline Hcl]     AGITATED    Physical exam:  Today's Vitals   10/25/19 0832  BP: 133/75  Pulse: 87  Temp: 98.2 F (36.8 C)  Weight: 207 lb (93.9 kg)  Height: 5' 10.5" (1.791 m)   Body mass index is 29.28 kg/m.   Wt Readings from Last 3 Encounters:  10/25/19 207 lb (93.9 kg)  10/18/19 216 lb (98 kg)  10/16/19 215 lb (97.5 kg)     Ht  Readings from Last 3 Encounters:  10/25/19 5' 10.5" (1.791 m)  10/18/19 5\' 10"  (1.778 m)  10/16/19 5' 10.5" (1.791 m)      General: The patient is awake, alert and appears not in acute distress. The patient is well groomed. He is tattooed.  Head: Normocephalic, atraumatic. Neck is supple. Mallampati 2- narrow from lateral pillars. ,  neck circumference:17 inches . Nasal airflow is not  patent. Rhinitis is chronic.  Dental status:  Biological  Cardiovascular:  Regular rate and cardiac rhythm by pulse,  without distended neck veins. Respiratory: Lungs are clear to auscultation.  Skin:  Without evidence of ankle edema, or rash. Trunk: The patient's posture is erect.   Neurologic exam : The patient is awake and alert, oriented to place and time.   Memory subjective described as intact.  Attention span & concentration ability appears normal.  Speech is fluent,  without  dysarthria, dysphonia or aphasia.  Mood and affect are appropriate.   Cranial nerves: no loss of smell or taste reported  Pupils are equal and briskly reactive to light. Funduscopic exam deferred. .  Extraocular movements in vertical and horizontal planes were intact and without nystagmus. No Diplopia. Visual fields by finger perimetry are intact. Hearing was intact to soft voice and finger rubbing.    Facial sensation intact to fine touch.  Facial motor strength is symmetric and tongue and uvula move midline.  Neck ROM : rotation, tilt and flexion extension were normal for age and shoulder shrug was symmetrical.  Motor exam:  Symmetric bulk except for biceps ( smaller on the left, one caput missing) , normal tone and restricted ROM for shoulder and elbow. Had 2 back surgeries- no neck surgeries.   Normal tone without cog wheeling, symmetric grip strength .   Sensory:  Fine touch, pinprick and vibration were normal.  Proprioception tested in the upper extremities was normal.   Coordination: Rapid alternating  movements in the fingers/hands were of normal speed.  The Finger-to-nose maneuver was intact without evidence of ataxia, dysmetria or tremor.   Gait and station: Patient could rise unassisted from a seated position, walked without assistive device.  Stance is of normal width/ base and the patient was observed turning with 3 steps.  Toe and heel walk were deferred.  Deep tendon reflexes: in the  upper and lower extremities are bilaterally attenuated .  Babinski response was deferred .     He reports being hyper-focussed some days, and sleepier and unable to follow through on others, may need not to sleep for a couple of days. He is not tearful, but was until he started Sprev[place] (?).     After spending a total time of 40 minutes face to face and  physical and neurologic examination, review of laboratory studies,  personal review of imaging studies, reports and results of other testing and review of referral information / records as far as provided in visit, I have established the following assessments:  1) Mr. Rocchio prefers to go by his middle name , Craig [name], and reports that he has been suffering from anxiety and depression episodes in the past, currently his mood and ability to focus is at a good baseline.  He does feel that his sleep does not have the quality to really refreshed and restored him in quite often he naps during the day when he is not physically active or mentally stimulated.   He endorsed the Epworth sleepiness score today at 16 points which is very high.   The first diagnosis is excessive daytime sleepiness.    #2 he has been witnessed to snore and to have apnea both by his spouse as well as by hospital employees who observed him during a recent admission for a possible TIA, the diagnosis was corrected to a possible complicated migraine in spite of his headaches being frontal.  He did have some visual changes visit.    3)He has been taking Ambien as needed has been taking Flexeril  which also helps with his multiple muscular and joint pain issues, and Xanax for anxiety.  He does smoke marijuana.  I do not think that a narcolepsy work-up is in order because he does not have to sleep attacks, he does not report any sleep paralysis or dream intrusion.  On the contrary he feels that he really very rarely dreams and can hardly remember that he dreams at all.  He usually gets about 7 hours of sleep at night sometimes more.  4)Rhinitis.   My Plan is to proceed with:  1) We can either screen for OSA by HST or by attended sleep study-   I would like to thank Dr Jaynee Eagles for allowing me to meet with and to take care of this pleasant patient.   In short, Craig [name] will  follow up either personally or through our NP within 2-3 month.   CC: I will share my notes with PCP  Electronically signed by: Larey Seat, MD 10/25/2019 8:43 AM  [facility] [place]  Sleep Board certified by The [employer] of Sleep Medicine and Diplomate of the [place] of Sleep Medicine. Board certified In Neurology through the [place], Fellow of the [employer] of Neurology. Medical Director of [employer].

## 2019-10-26 ENCOUNTER — Other Ambulatory Visit: Payer: Self-pay | Admitting: Neurology

## 2019-10-26 MED ORDER — ATORVASTATIN CALCIUM 20 MG PO TABS: 20.0000 mg | ORAL_TABLET | Freq: Every day | ORAL | 6 refills | Status: DC

## 2019-10-26 NOTE — Telephone Encounter (Signed)
Ordered, thanks.

## 2019-10-26 NOTE — Progress Notes (Signed)
l °

## 2019-10-29 ENCOUNTER — Encounter: Payer: Self-pay | Admitting: *Deleted

## 2019-10-29 NOTE — Telephone Encounter (Signed)
Thanks, I sent the pt a mychart message with Lipitor information.

## 2019-10-30 ENCOUNTER — Other Ambulatory Visit: Payer: Self-pay

## 2019-10-30 ENCOUNTER — Ambulatory Visit (HOSPITAL_COMMUNITY): Payer: Managed Care, Other (non HMO) | Attending: Neurology

## 2019-10-30 DIAGNOSIS — G459 Transient cerebral ischemic attack, unspecified: Secondary | ICD-10-CM | POA: Diagnosis not present

## 2019-10-30 DIAGNOSIS — R079 Chest pain, unspecified: Secondary | ICD-10-CM | POA: Diagnosis present

## 2019-10-30 MED ORDER — SODIUM CHLORIDE 0.9% FLUSH
16.0000 mL | INTRAVENOUS | Status: AC | PRN
  Administered 2019-10-30: 16 mL

## 2019-10-31 NOTE — Progress Notes (Signed)
Echocardiogram of your heart doesn't show anything concerning for causing a stroke. Nothing concerning in Echo but I will also send to your primary care for review thanks

## 2019-11-14 ENCOUNTER — Ambulatory Visit (INDEPENDENT_AMBULATORY_CARE_PROVIDER_SITE_OTHER): Payer: Managed Care, Other (non HMO) | Admitting: Neurology

## 2019-11-14 DIAGNOSIS — G4710 Hypersomnia, unspecified: Secondary | ICD-10-CM

## 2019-11-14 DIAGNOSIS — R079 Chest pain, unspecified: Secondary | ICD-10-CM

## 2019-11-14 DIAGNOSIS — G4719 Other hypersomnia: Secondary | ICD-10-CM

## 2019-11-14 DIAGNOSIS — G43109 Migraine with aura, not intractable, without status migrainosus: Secondary | ICD-10-CM | POA: Diagnosis not present

## 2019-11-14 DIAGNOSIS — G459 Transient cerebral ischemic attack, unspecified: Secondary | ICD-10-CM

## 2019-11-14 DIAGNOSIS — J300 Vasomotor rhinitis: Secondary | ICD-10-CM

## 2019-11-14 DIAGNOSIS — R0681 Apnea, not elsewhere classified: Secondary | ICD-10-CM

## 2019-11-23 NOTE — Progress Notes (Signed)
Summary & Diagnosis:   The general AHI for this HST is 20.5/h and strongly exacerbated  by supine sleep position ( AHI supine is 70.5). There is no REM  sleep dependency, unusual for OSA. Moderate snoring was captured,  reflected in the RDI of 21.7/h.  Normal heart rate variability and no hypoxemia of clinical  significance.   Recommendations:    I can recommend dental device or CPAP therapy for this patient,  but he will benefit most from avoiding supine sleep.  If a change in sleep position is not possible, CPAP or dental  device will be the next step.   Interpreting Physician: Larey Seat, MD

## 2019-11-23 NOTE — Procedures (Signed)
Patient Information     First Name: Craig Last Name: [name] ID: [id number]  Birth Date: 1966-08-31 Age: 54 Gender: Male  Referring Provider: Jaynee Eagles, MD BMI: 29.0 (W=207 lb, H=5' 11'')  Neck Circ.:  17 '' Epworth:  16/24   Sleep Study Information    Study Date: Nov 14, 2019 S/H/A Version: 001.001.001.001 / 4.0.1515 / 77  History:    Craig [name] is a 54 year- old Caucasian male patient and seen here upon referral by Dr. Jaynee Eagles on 10/25/2019 for an evaluation of possible sleep apnea. Chief concern according to patient:  " I was recently hospitalized, and the nurses told me I had apneas" Craig [name] is a right-handed Caucasian male with a possible sleep disorder.  He has a medical history of Bipolar affective disorder ([place]), remote history of Cocaine use in 2003, and GAD (generalized anxiety disorder).  He was hospitalized on 09-28-2019 with atypical chest pain to the ED- and later, on 10-16-2019,  for TIA/ ministrokes, but ED Dr Sherry Ruffing wrote that the patient had frontal headaches and memory loss- couldn't remember his password, and new onset headaches-  and was followed by Dr Jaynee Eagles on 10-18-2019. His tox screen showed benzodiazepine and cannabis.    Summary & Diagnosis:    The general AHI for this HST is 20.5/h and strongly exacerbated by supine sleep position ( AHI supine is 70.5). There is no REM sleep dependency, unusual for OSA. Moderate snoring was captured, reflected in the RDI of 21.7/h. Normal heart rate variability and no hypoxemia of clinical significance.   Recommendations:     I can recommend dental device or CPAP therapy for this patient, but he will benefit most from avoiding supine sleep.  If a change in sleep position is not possible, CPAP or dental device will be the next step.  Interpreting Physician: Larey Seat, MD             Sleep Summary  Oxygen Saturation Statistics   Start Study Time: End Study Time: Total Recording Time:          10:17:50 PM 6:03:12  AM   7 h, 45 min  Total Sleep Time % REM of Sleep Time:  6 h, 32 min  17.3    Mean: 94 Minimum: 85 Maximum: 99  Mean of Desaturations Nadirs (%):   91  Oxygen Desaturation. %:   4-9 10-20 >20 Total  Events Number Total    76  15 83.5 16.5  0 0.0  91 100.0  Oxygen Saturation: <90 <=88 <85 <80 <70  Duration (minutes): Sleep % 4.0 1.0  2.0 0.0  0.5 0.0 0.0 0.0 0.0 0.0     Respiratory Indices      Total Events REM NREM All Night  pRDI:  141  pAHI:  133 ODI:  91  pAHIc:  22  % CSR: 0.0 6.2 5.4 0.0 2.7 24.9 23.6 16.9 3.7 21.7 20.5 14.0 3.5       Pulse Rate Statistics during Sleep (BPM)      Mean: 81 Minimum: 51 Maximum: 104    Indices are calculated using technically valid sleep time of  6 h, 30 min. Central-Indices are calculated using technically valid sleep time of  6  h, 17 min. pRDI/pAHI are calculated using oxi desaturations ? 3%  Body Position Statistics  Position Supine Prone Right Left Non-Supine  Sleep (min) 92.4 0.0 132.5 167.5 300.0  Sleep % 23.5 0.0 33.8 42.7 76.5  pRDI 71.9 N/A 8.2  5.4 6.6  pAHI 70.5 N/A 7.7 3.6 5.4  ODI 55.9 N/A 2.7 0.4 1.4     Snoring Statistics Snoring Level (dB) >40 >50 >60 >70 >80 >Threshold (45)  Sleep (min) 96.3 20.0 4.4 1.0 0.0 35.0  Sleep % 24.5 5.1 1.1 0.3 0.0 8.9    Mean: 41 dB

## 2019-11-27 ENCOUNTER — Telehealth: Payer: Self-pay | Admitting: Neurology

## 2019-11-27 DIAGNOSIS — G4719 Other hypersomnia: Secondary | ICD-10-CM

## 2019-11-27 DIAGNOSIS — R0681 Apnea, not elsewhere classified: Secondary | ICD-10-CM

## 2019-11-27 NOTE — Telephone Encounter (Signed)
I called pt. I advised pt that Dr. Brett Fairy reviewed their sleep study results and found that pt has sleep apnea. Dr. Brett Fairy recommends that pt auto CPAP. I reviewed PAP compliance expectations with the pt. Pt is agreeable to starting a CPAP. I advised pt that an order will be sent to a DME, [facility], and [facility] will call the pt within about one week after they file with the pt's insurance. [facility] will show the pt how to use the machine, fit for masks, and troubleshoot the CPAP if needed. A follow up appt was made for insurance purposes with Ward Givens, NP on May 17,2021 at 3 pm. Pt verbalized understanding to arrive 15 minutes early and bring their CPAP. A letter with all of this information in it will be mailed to the pt as a reminder. I verified with the pt that the address we have on file is correct. Pt verbalized understanding of results. Pt had no questions at this time but was encouraged to call back if questions arise. I have sent the order to [facility] and have received confirmation that they have received the order.

## 2019-11-27 NOTE — Telephone Encounter (Signed)
-----   Message from Darleen Crocker, RN sent at 11/23/2019 12:23 PM EST -----  ----- Message ----- From: Larey Seat, MD Sent: 11/23/2019  11:22 AM EST To: Gaynelle Arabian, MD, Darleen Crocker, RN, #  Summary & Diagnosis:   The general AHI for this HST is 20.5/h and strongly exacerbated  by supine sleep position ( AHI supine is 70.5). There is no REM  sleep dependency, unusual for OSA. Moderate snoring was captured,  reflected in the RDI of 21.7/h.  Normal heart rate variability and no hypoxemia of clinical  significance.   Recommendations:    I can recommend dental device or CPAP therapy for this patient,  but he will benefit most from avoiding supine sleep.  If a change in sleep position is not possible, CPAP or dental  device will be the next step.   Interpreting Physician: Larey Seat, MD

## 2020-02-02 ENCOUNTER — Encounter: Payer: Self-pay | Admitting: Adult Health

## 2020-02-04 ENCOUNTER — Ambulatory Visit (INDEPENDENT_AMBULATORY_CARE_PROVIDER_SITE_OTHER): Payer: Managed Care, Other (non HMO) | Admitting: Adult Health

## 2020-02-04 ENCOUNTER — Other Ambulatory Visit: Payer: Self-pay

## 2020-02-04 ENCOUNTER — Encounter: Payer: Self-pay | Admitting: Adult Health

## 2020-02-04 VITALS — BP 147/78 | HR 100 | Temp 98.2°F | Wt 210.0 lb

## 2020-02-04 DIAGNOSIS — Z9989 Dependence on other enabling machines and devices: Secondary | ICD-10-CM | POA: Diagnosis not present

## 2020-02-04 DIAGNOSIS — G4733 Obstructive sleep apnea (adult) (pediatric): Secondary | ICD-10-CM

## 2020-02-04 NOTE — Patient Instructions (Signed)
Continue using CPAP nightly and greater than 4 hours each night °If your symptoms worsen or you develop new symptoms please let us know.  ° °

## 2020-02-04 NOTE — Progress Notes (Signed)
PATIENT: Craig [name] DOB: Jul 06, 1966  REASON FOR VISIT: follow up HISTORY FROM: patient  HISTORY OF PRESENT ILLNESS: [name] 02/04/20:  Craig [name] is a 54 year old male with a history of obstructive sleep apnea on CPAP.  Craig [name] returns [name] for follow-up.  His download indicates that Craig [name] uses machine 29 out of 30 days for compliance of 97%.  Craig [name] uses machine greater than 4 hours 17 days for compliance of 57%.  Craig [name] reports that Craig [name] changed to a full facemask in the last week and this has been working much better for him.  Craig [name] states that the other mask would not stay on his face.  Craig [name] reports that since Craig [name] change the mask Craig [name] is only have one occasion that the mass came off.  On average Craig [name] uses his machine 4 hours and 28 minutes.  His residual AHI is 15.5 on 5 to 15 cm of water.  His pressure was a maximum of 12.7.  However his leak in the 95th percentile is 60.9 L/min.  His high leak most likely is contributing to high AHI.  HISTORY Craig [name] a 54 year old Caucasian male patient isseen here upon referral by Dr. Royetta Car 10/25/2019 for an evaluation of possible sleep apnea. Chiefconcernaccording to patient : " I was recently hospitalized and the nurses told me I had apneas"  I have the pleasure of seeing Craig [name],a right-handed Caucasian male with a possible sleep disorder. Craig [name]  has a past medical history of Bipolar affective disorder ([place]), remote history of Cocaine use in 2003, and GAD (generalized anxiety disorder).   Craig [name] was hospitalized on 09-28-2019 with atypical chest pain to the ED- and later, on 10-16-2019,  for TIA/ ministrokes, but ED Dr Sherry Ruffing wrote that the patient had frontal headaches and memory loss- couldn't remember his password, and new onset headaches-  and was followed by Dr Jaynee Eagles on 10-18-2019.  His tox screen showed benzodiazepine and cannabis.   Sleeprelevant medical history: snoring, quits breathing, wife took video of him sleeping ,  documenting   Apnea.  Familymedical /sleep history:No other family member with OSA, no known insomnia, no sleep walkers.Mother  has been sundowning, evident since her husband passed in 2019.   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 His widowed mother lives close and has a large yard and garden.  Family status is remarried , with one adult daughter and a granddaughter. Pets are present - a dog. Tobacco use- formerly . Remote ETOH use; formerly - quit 17 years ago. Caffeine intake in form of Coffee(none ) Soda( none) Tea ( none) but used AM energy drinks. Regular exercise in form of yard work.   Hobbies :gardening, flowers, herbs,     Sleep habits are as follows:The patient's dinner time is between  6.30 PM. The patient goes to bed at 10.30 PM and asleep before 11 Pm, Craig [name] continues to sleep for several  hours, wakes for 1-2 bathroom breaks, the first time at 2-3 AM.  Wife reports him gasping, sometimes Craig [name] wakes up snoring- and usually while watching TV - dozing sofa.  The preferred sleep position is variable - lots of aches and pains- , with the support of 2 pillows. Left torn rotator cuff, torn biceps tendons bilateral -  Dreams are reportedly rare..    6 AM is the usual rise time. The patient wakes up spontaneously 5.45 without an alarm.  Craig [name] reports not feeling refreshed or restored in AM, mostly with symptoms such as dry mouth , no morning headaches 9 not a headache person ) , and high residual fatigue.  unscheduled Naps are taken frequently, lasting from 15-45 minutes and are as refreshing as nocturnal sleep.    REVIEW OF SYSTEMS: Out of a complete 14 system review of symptoms, the patient complains only of the following symptoms, and all other reviewed systems are negative.  Fatigue severity score 27 Epworth sleepiness score 14  ALLERGIES: Allergies  Allergen Reactions  . Celexa [Citalopram Hydrobromide]     . Effexor [Venlafaxine]   . Lexapro [Escitalopram Oxalate]     AGITATED   . Paxil [Paroxetine Hcl]     AGITATED   . Zoloft [Sertraline Hcl]     AGITATED    HOME MEDICATIONS: Outpatient Medications Prior to Visit  Medication Sig Dispense Refill  . ALPRAZolam (XANAX) 1 MG tablet Take 1 mg by mouth every 4 (four) hours.     Marland Kitchen aspirin EC 81 MG tablet Take 1 tablet (81 mg total) by mouth daily. 90 tablet 3  . aspirin EC 81 MG tablet Take 81 mg by mouth daily as needed for moderate pain.    Marland Kitchen atorvastatin (LIPITOR) 20 MG tablet Take 1 tablet (20 mg total) by mouth daily at 6 PM. 90 tablet 6  . cyclobenzaprine (FLEXERIL) 5 MG tablet Take 5 mg by mouth daily as needed for muscle spasms.   0  . Multiple Vitamin (MULTIVITAMIN) tablet Take 1 tablet by mouth daily.    Marland Kitchen omeprazole (PRILOSEC) 20 MG capsule Take 20 mg by mouth daily as needed (indigestion).     . sildenafil (VIAGRA) 100 MG tablet Take 100 mg by mouth daily as needed for erectile dysfunction.    Marland Kitchen SPRAVATO, 84 MG DOSE, 28 MG/DEVICE SOPK Inhale 84 mg into the lungs 2 (two) times a week. Mon and Wed    . zolpidem (AMBIEN) 10 MG tablet Take 10 mg by mouth at bedtime.     Facility-Administered Medications Prior to Visit  Medication Dose Route Frequency Provider Last Rate Last Admin  . sodium chloride flush (NS) 0.9 % injection 16 mL  16 mL Intracatheter PRN Melvenia Beam, MD   16 mL at 10/30/19 1130    PAST MEDICAL HISTORY: Past Medical History:  Diagnosis Date  . Bipolar affective disorder ([place])   . Cocaine use    QUIT IN 2003  . GAD (generalized anxiety disorder)     PAST SURGICAL HISTORY: Past Surgical History:  Procedure Laterality Date  . BACK SURGERY    . KNEE SURGERY     x2  . LEFT BICEPS TEAR  05/2011   x2  . [place] SURGERY  1999  . right bicep tendon repair    . RIGHT HAND FRACTURE  2011    FAMILY HISTORY: Family History  Problem Relation Age of Onset  . CAD Father   . Heart attack Mother   .  Stroke Mother   . Lupus Mother   . Heart disease Maternal Grandfather   . Heart failure Maternal Grandfather     SOCIAL HISTORY: Social History   Socioeconomic History  . Marital status: Married    Spouse name: Not on file  . Number of children: Not on file  . Years of education: Not on file  . Highest education level: Not on file  Occupational History  . Not on file  Tobacco Use  . Smoking status:  Former Smoker  . Smokeless tobacco: Never Used  Substance and Sexual Activity  . Alcohol use: Never    Comment: quit 2003   . Drug use: Yes    Types: Cocaine, Marijuana    Comment: quit cocaine 2003; smokes weed sometimes now  . Sexual activity: Yes  Other Topics Concern  . Not on file  Social History Narrative   Lives at home with wife Sharyn Lull    Right handed   Caffeine: sugar free Rip-it energy drink in the mornings   Social Determinants of Health   Financial Resource Strain:   . Difficulty of Paying Living Expenses:   Food Insecurity:   . Worried About Charity fundraiser in the Last Year:   . Arboriculturist in the Last Year:   Transportation Needs:   . Film/video editor (Medical):   Marland Kitchen Lack of Transportation (Non-Medical):   Physical Activity:   . Days of Exercise per Week:   . Minutes of Exercise per Session:   Stress:   . Feeling of Stress :   Social Connections:   . Frequency of Communication with Friends and Family:   . Frequency of Social Gatherings with Friends and Family:   . Attends Religious Services:   . Active Member of Clubs or Organizations:   . Attends Archivist Meetings:   Marland Kitchen Marital Status:   Intimate Partner Violence:   . Fear of Current or Ex-Partner:   . Emotionally Abused:   Marland Kitchen Physically Abused:   . Sexually Abused:       PHYSICAL EXAM  Vitals:   02/04/20 1451  BP: (!) 147/78  Pulse: 100  Temp: 98.2 F (36.8 C)  Weight: 210 lb (95.3 kg)   Body mass index is 29.71 kg/m.  Generalized: Well developed, in no  acute distress   Neurological examination  Mentation: Alert oriented to time, place, history taking. Follows all commands speech and language fluent Cranial nerve II-XII: Pupils were equal round reactive to light. Extraocular movements were full, visual field were full on confrontational test. Facial sensation and strength were normal. Uvula tongue midline. Head turning and shoulder shrug  were normal and symmetric. Motor: The motor testing reveals 5 over 5 strength of all 4 extremities. Good symmetric motor tone is noted throughout.  Sensory: Sensory testing is intact to soft touch on all 4 extremities. No evidence of extinction is noted.  Coordination: Cerebellar testing reveals good finger-nose-finger and heel-to-shin bilaterally.  Gait and station: Gait is normal.  Reflexes: Deep tendon reflexes are symmetric and normal bilaterally.   DIAGNOSTIC DATA (LABS, IMAGING, TESTING) - I reviewed patient records, labs, notes, testing and imaging myself where available.  Lab Results  Component Value Date   WBC 6.5 10/16/2019   HGB 16.7 10/16/2019   HCT 49.0 10/16/2019   MCV 94.9 10/16/2019   PLT 258 10/16/2019      Component Value Date/Time   NA 139 10/16/2019 1510   K 3.7 10/16/2019 1510   CL 102 10/16/2019 1510   CO2 27 10/16/2019 1456   GLUCOSE 109 (H) 10/16/2019 1510   BUN 21 (H) 10/16/2019 1510   CREATININE 1.10 10/16/2019 1510   CALCIUM 9.5 10/16/2019 1456   PROT 7.2 10/16/2019 1456   ALBUMIN 4.4 10/16/2019 1456   AST 29 10/16/2019 1456   ALT 28 10/16/2019 1456   ALKPHOS 53 10/16/2019 1456   BILITOT 1.5 (H) 10/16/2019 1456   GFRNONAA >60 10/16/2019 1456   GFRAA >60 10/16/2019 1456  Lab Results  Component Value Date   CHOL 174 10/18/2019   HDL 33 (L) 10/18/2019   LDLCALC 126 (H) 10/18/2019   TRIG 78 10/18/2019   CHOLHDL 5.3 (H) 10/18/2019   Lab Results  Component Value Date   HGBA1C 5.2 10/18/2019     ASSESSMENT AND PLAN 54 y.o. year old male  has a past  medical history of Bipolar affective disorder ([place]), Cocaine use, and GAD (generalized anxiety disorder). here with :  1.  Obstructive sleep apnea on CPAP  -CPAP compliance is suboptimal however it has improved in the last week with mask change -Residual AHI is elevated this is most likely due to high leak -Advised patient Craig [name] should try to use machine nightly and greater than 4 hours each night -Also advised patient that his h mask continues to leak Craig [name] should let us know   I spent 20 minutes of face-to-face and non-face-to-face time with patient.  This included previsit chart review, lab review, study review, order entry, electronic health record documentation, patient education.  Ward Givens, MSN, NP-C 02/04/2020, 2:54 PM [facility] [address], [place], [place] [address] [phone]

## 2020-02-11 ENCOUNTER — Ambulatory Visit (HOSPITAL_COMMUNITY)
Admission: EM | Admit: 2020-02-11 | Discharge: 2020-02-11 | Disposition: A | Discharge status: Home / Self Care | Payer: Medicare Other | Attending: Family Medicine | Admitting: Family Medicine

## 2020-02-11 ENCOUNTER — Encounter (HOSPITAL_COMMUNITY): Payer: Self-pay | Admitting: Emergency Medicine

## 2020-02-11 ENCOUNTER — Other Ambulatory Visit: Payer: Self-pay

## 2020-02-11 DIAGNOSIS — M542 Cervicalgia: Secondary | ICD-10-CM | POA: Diagnosis not present

## 2020-02-11 MED ORDER — IBUPROFEN 800 MG PO TABS
800.0000 mg | ORAL_TABLET | Freq: Three times a day (TID) | ORAL | 0 refills | Status: DC
Start: 2020-02-11 — End: 2023-09-07

## 2020-02-11 MED ORDER — HYDROCODONE-ACETAMINOPHEN 7.5-325 MG PO TABS: 1.0000 | ORAL_TABLET | Freq: Four times a day (QID) | ORAL | 0 refills | Status: DC | PRN

## 2020-02-11 MED ORDER — TIZANIDINE HCL 4 MG PO TABS: 4.0000 mg | ORAL_TABLET | Freq: Four times a day (QID) | ORAL | 0 refills | Status: DC | PRN

## 2020-02-11 NOTE — Discharge Instructions (Addendum)
Stop Flexeril Continue ibuprofen 3 times a day with food Try tizanidine for muscle spasm Hydrocodone is given for severe pain only Do not take hydrocodone and drive Do not take hydrocodone with Xanax, can cause excessive drowsiness Follow-up with your usual primary care doctor.

## 2020-02-11 NOTE — ED Triage Notes (Signed)
Pt c/o head and neck pain ongoing this is a chronic problem that he sees a chiropractor for. He gets regular massages to try and help the pain.

## 2020-02-11 NOTE — ED Provider Notes (Signed)
[name]    CSN: [id number] Arrival date & time: 02/11/20  1504      History   Chief Complaint Chief Complaint  Patient presents with  . Headache  . Neck Pain    HPI Craig [name] is a 54 y.o. male.   HPI   Patient has a longstanding history of neck pain and headaches.  This is been going on for some time.  He is under the care of chiropractor.  He sees neurology for headaches and TIA symptoms.  He gets massage therapy and chiropractic therapy 3-5 times a week.  He has a prescription for Flexeril 5 mg that he takes 3 times daily for low back pain.  He rarely takes this for his neck pain.  He states is not working.  He takes ibuprofen 800 mg 3 times a day, over-the-counter.  He does not have anything else for pain.  He states the pain is "severe". He went to his chiropractor today.  He had reported increased pain.  Severe onset of headache.  Headache is on the right side of his head.  No visual symptoms.  No neuro symptoms.  No nausea or vomiting.  He thinks the headache is because of the increased neck pain and stiffness. No more mental confusion   Past Medical History:  Diagnosis Date  . Bipolar affective disorder ([place])   . Cocaine use    QUIT IN 2003  . GAD (generalized anxiety disorder)     Patient Active Problem List   Diagnosis Date Noted  . Excessive daytime sleepiness 10/25/2019  . Witnessed apneic spells 10/25/2019  . 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 Vasomotor rhinitis 10/25/2019  . TIA (transient ischemic attack) 10/18/2019  . Fatigue 10/23/2013  . Weakness 10/23/2013  . Chest pain 10/23/2013  . Chest congestion 10/23/2013    Past Surgical History:  Procedure Laterality Date  . BACK SURGERY    . KNEE SURGERY     x2  . LEFT BICEPS TEAR  05/2011   x2  . [place] SURGERY  1999  . right bicep tendon repair    . RIGHT HAND FRACTURE  2011       Home Medications    Prior to Admission medications   Medication Sig Start Date End  Date Taking? Authorizing Provider  ALPRAZolam Duanne Moron) 1 MG tablet Take 1 mg by mouth every 4 (four) hours.    Yes [provider]  aspirin EC 81 MG tablet Take 1 tablet (81 mg total) by mouth daily. 10/25/13  Yes Nahser, Wonda Cheng, MD  cyclobenzaprine (FLEXERIL) 5 MG tablet Take 5 mg by mouth daily as needed for muscle spasms.  05/11/18  Yes [provider]  Multiple Vitamin (MULTIVITAMIN) tablet Take 1 tablet by mouth daily.   Yes [provider]  omeprazole (PRILOSEC) 20 MG capsule Take 20 mg by mouth daily as needed (indigestion).  08/31/19  Yes [provider]  sildenafil (VIAGRA) 100 MG tablet Take 100 mg by mouth daily as needed for erectile dysfunction. 09/24/19  Yes [provider]  SPRAVATO, 84 MG DOSE, 28 MG/DEVICE SOPK Inhale 84 mg into the lungs 2 (two) times a week. Mon and Wed 09/24/19  Yes [provider]  zolpidem (AMBIEN) 10 MG tablet Take 10 mg by mouth at bedtime. 09/24/19  Yes [provider]  aspirin EC 81 MG tablet Take 81 mg by mouth daily as needed for moderate pain.    [provider]  atorvastatin (LIPITOR)  20 MG tablet Take 1 tablet (20 mg total) by mouth daily at 6 PM. Patient not taking: Reported on 02/04/2020 10/26/19   Melvenia Beam, MD  HYDROcodone-acetaminophen (NORCO) 7.5-325 MG tablet Take 1 tablet by mouth every 6 (six) hours as needed for moderate pain. 02/11/20   Raylene Everts, MD  ibuprofen (ADVIL) 800 MG tablet Take 1 tablet (800 mg total) by mouth 3 (three) times daily. 02/11/20   Raylene Everts, MD  tiZANidine (ZANAFLEX) 4 MG tablet Take 1-2 tablets (4-8 mg total) by mouth every 6 (six) hours as needed for muscle spasms. 02/11/20   Raylene Everts, MD    Family History Family History  Problem Relation Age of Onset  . CAD Father   . Heart attack Mother   . Stroke Mother   . Lupus Mother   . Heart disease Maternal Grandfather   . Heart failure Maternal Grandfather     Social  History Social History   Tobacco Use  . Smoking status: Former Research scientist (life sciences)  . Smokeless tobacco: Never Used  Substance Use Topics  . Alcohol use: Never    Comment: quit 2003   . Drug use: Yes    Types: Cocaine, Marijuana    Comment: quit cocaine 2003; smokes weed sometimes now     Allergies   Celexa [citalopram hydrobromide], Effexor [venlafaxine], Lexapro [escitalopram oxalate], Paxil [paroxetine hcl], and Zoloft [sertraline hcl]   Review of Systems Review of Systems  Musculoskeletal: Positive for neck pain and neck stiffness.  Neurological: Positive for headaches. Negative for weakness and numbness.  Psychiatric/Behavioral: Negative for confusion.     Physical Exam Triage Vital Signs ED Triage Vitals  Enc Vitals Group     BP 02/11/20 1639 140/72     Pulse Rate 02/11/20 1639 99     Resp 02/11/20 1639 16     Temp 02/11/20 1639 99.2 F (37.3 C)     Temp Source 02/11/20 1639 Oral     SpO2 02/11/20 1639 99 %     Weight --      Height --      Head Circumference --      Peak Flow --      Pain Score 02/11/20 1648 6     Pain Loc --      Pain Edu? --      Excl. in [place]? --    No data found.  Updated Vital Signs BP 140/72 (BP Location: Left Arm)   Pulse 99   Temp 99.2 F (37.3 C) (Oral)   Resp 16   SpO2 99%   Physical Exam Constitutional:      General: He is not in acute distress.    Appearance: He is well-developed and normal weight.     Comments: Appears uncomfortable  HENT:     Head: Normocephalic and atraumatic.     Right Ear: Tympanic membrane, ear canal and external ear normal.     Left Ear: Tympanic membrane, ear canal and external ear normal.     Nose: No congestion.     Mouth/Throat:     Mouth: Mucous membranes are moist.     Pharynx: No posterior oropharyngeal erythema.     Comments: Mask is in place No sinus tenderness Eyes:     Conjunctiva/sclera: Conjunctivae normal.     Pupils: Pupils are equal, round, and reactive to light.  Cardiovascular:      Rate and Rhythm: Normal rate.  Pulmonary:     Effort: Pulmonary effort is  normal. No respiratory distress.  Musculoskeletal:        General: Normal range of motion.     Cervical back: Normal range of motion.     Comments: Patient has very limited range of motion.  He comes to a sudden stop when asked to turn his head to look up or down.  He states that rapid movements are very painful.  He has tenderness palpation in the upper body of the trapezius muscles bilaterally and in the paraspinal cervical muscles.  No tenderness over the C-spine's.  Strength sensation range of motion reflexes normal in both upper extremities  Skin:    General: Skin is warm and dry.  Neurological:     Mental Status: He is alert.  Psychiatric:        Mood and Affect: Mood normal.        Behavior: Behavior normal.      UC Treatments / Results  Labs (all labs ordered are listed, but only abnormal results are displayed) Labs Reviewed - No data to display  EKG   Radiology No results found.  Procedures Procedures (including critical care time)  Medications Ordered in UC Medications - No data to display  Initial Impression / Assessment and Plan / UC Course  I have reviewed the triage vital signs and the nursing notes.  Pertinent labs & imaging results that were available during my care of the patient were reviewed by me and considered in my medical decision making (see chart for details).     Final Clinical Impressions(s) / UC Diagnoses   Final diagnoses:  Neck pain     Discharge Instructions     Stop Flexeril Continue ibuprofen 3 times a day with food Try tizanidine for muscle spasm Hydrocodone is given for severe pain only Do not take hydrocodone and drive Do not take hydrocodone with Xanax, can cause excessive drowsiness Follow-up with your usual primary care doctor.    ED Prescriptions    Medication Sig Dispense Auth. Provider   tiZANidine (ZANAFLEX) 4 MG tablet Take 1-2 tablets  (4-8 mg total) by mouth every 6 (six) hours as needed for muscle spasms. 21 tablet Raylene Everts, MD   ibuprofen (ADVIL) 800 MG tablet Take 1 tablet (800 mg total) by mouth 3 (three) times daily. 21 tablet Raylene Everts, MD   HYDROcodone-acetaminophen [facility]) 7.5-325 MG tablet Take 1 tablet by mouth every 6 (six) hours as needed for moderate pain. 10 tablet Raylene Everts, MD     I have reviewed the PDMP during this encounter.   Raylene Everts, MD 02/11/20 [phone]

## 2020-02-15 ENCOUNTER — Encounter (HOSPITAL_COMMUNITY): Payer: Self-pay | Admitting: Emergency Medicine

## 2020-02-15 ENCOUNTER — Emergency Department (HOSPITAL_COMMUNITY)
Admission: EM | Admit: 2020-02-15 | Discharge: 2020-02-15 | Disposition: A | Discharge status: Home / Self Care | Payer: Managed Care, Other (non HMO) | Attending: Emergency Medicine | Admitting: Emergency Medicine

## 2020-02-15 ENCOUNTER — Other Ambulatory Visit: Payer: Self-pay

## 2020-02-15 DIAGNOSIS — Y929 Unspecified place or not applicable: Secondary | ICD-10-CM | POA: Diagnosis not present

## 2020-02-15 DIAGNOSIS — Z79899 Other long term (current) drug therapy: Secondary | ICD-10-CM | POA: Diagnosis not present

## 2020-02-15 DIAGNOSIS — Z8673 Personal history of transient ischemic attack (TIA), and cerebral infarction without residual deficits: Secondary | ICD-10-CM | POA: Insufficient documentation

## 2020-02-15 DIAGNOSIS — F1410 Cocaine abuse, uncomplicated: Secondary | ICD-10-CM | POA: Diagnosis not present

## 2020-02-15 DIAGNOSIS — X58XXXA Exposure to other specified factors, initial encounter: Secondary | ICD-10-CM | POA: Insufficient documentation

## 2020-02-15 DIAGNOSIS — Y998 Other external cause status: Secondary | ICD-10-CM | POA: Diagnosis not present

## 2020-02-15 DIAGNOSIS — Z87891 Personal history of nicotine dependence: Secondary | ICD-10-CM | POA: Insufficient documentation

## 2020-02-15 DIAGNOSIS — S199XXA Unspecified injury of neck, initial encounter: Secondary | ICD-10-CM | POA: Diagnosis present

## 2020-02-15 DIAGNOSIS — R519 Headache, unspecified: Secondary | ICD-10-CM

## 2020-02-15 DIAGNOSIS — Y939 Activity, unspecified: Secondary | ICD-10-CM | POA: Diagnosis not present

## 2020-02-15 DIAGNOSIS — F1210 Cannabis abuse, uncomplicated: Secondary | ICD-10-CM | POA: Diagnosis not present

## 2020-02-15 DIAGNOSIS — Z7982 Long term (current) use of aspirin: Secondary | ICD-10-CM | POA: Insufficient documentation

## 2020-02-15 DIAGNOSIS — S161XXA Strain of muscle, fascia and tendon at neck level, initial encounter: Secondary | ICD-10-CM

## 2020-02-15 LAB — CBC WITH DIFFERENTIAL/PLATELET
Abs Immature Granulocytes: 0.03 10*3/uL (ref 0.00–0.07)
Basophils Absolute: 0.1 10*3/uL (ref 0.0–0.1)
Basophils Relative: 1 %
Eosinophils Absolute: 0 10*3/uL (ref 0.0–0.5)
Eosinophils Relative: 0 %
HCT: 47.9 % (ref 39.0–52.0)
Hemoglobin: 15.8 g/dL (ref 13.0–17.0)
Immature Granulocytes: 0 %
Lymphocytes Relative: 19 %
Lymphs Abs: 1.6 10*3/uL (ref 0.7–4.0)
MCH: 32 pg (ref 26.0–34.0)
MCHC: 33 g/dL (ref 30.0–36.0)
MCV: 97.2 fL (ref 80.0–100.0)
Monocytes Absolute: 0.8 10*3/uL (ref 0.1–1.0)
Monocytes Relative: 10 %
Neutro Abs: 5.9 10*3/uL (ref 1.7–7.7)
Neutrophils Relative %: 70 %
Platelets: 323 10*3/uL (ref 150–400)
RBC: 4.93 MIL/uL (ref 4.22–5.81)
RDW: 12.8 % (ref 11.5–15.5)
WBC: 8.5 10*3/uL (ref 4.0–10.5)
nRBC: 0 % (ref 0.0–0.2)

## 2020-02-15 LAB — COMPREHENSIVE METABOLIC PANEL
ALT: 103 U/L — ABNORMAL HIGH (ref 0–44)
AST: 80 U/L — ABNORMAL HIGH (ref 15–41)
Albumin: 4 g/dL (ref 3.5–5.0)
Alkaline Phosphatase: 91 U/L (ref 38–126)
Anion gap: 10 (ref 5–15)
BUN: 17 mg/dL (ref 6–20)
CO2: 28 mmol/L (ref 22–32)
Calcium: 9.2 mg/dL (ref 8.9–10.3)
Chloride: 99 mmol/L (ref 98–111)
Creatinine, Ser: 1.11 mg/dL (ref 0.61–1.24)
GFR calc Af Amer: >60 mL/min (ref >60)
GFR calc non Af Amer: >60 mL/min (ref >60)
Glucose, Bld: 101 mg/dL — ABNORMAL HIGH (ref 70–99)
Potassium: 4.3 mmol/L (ref 3.5–5.1)
Sodium: 137 mmol/L (ref 135–145)
Total Bilirubin: 0.8 mg/dL (ref 0.3–1.2)
Total Protein: 7.8 g/dL (ref 6.5–8.1)

## 2020-02-15 LAB — CK: Total CK: 158 U/L (ref 49–397)

## 2020-02-15 MED ORDER — DROPERIDOL 2.5 MG/ML IJ SOLN
2.5000 mg | Freq: Once | INTRAMUSCULAR | Status: AC
  Administered 2020-02-15: 2.5 mg via INTRAVENOUS
  Filled 2020-02-15: qty 2

## 2020-02-15 MED ORDER — SODIUM CHLORIDE 0.9 % IV BOLUS
1000.0000 mL | Freq: Once | INTRAVENOUS | Status: AC
  Administered 2020-02-15: 1000 mL via INTRAVENOUS

## 2020-02-15 MED ORDER — PREDNISONE 20 MG PO TABS
40.0000 mg | ORAL_TABLET | Freq: Every day | ORAL | 0 refills | Status: DC
Start: 2020-02-15 — End: 2020-11-05

## 2020-02-15 MED ORDER — KETOROLAC TROMETHAMINE 30 MG/ML IJ SOLN
30.0000 mg | Freq: Once | INTRAMUSCULAR | Status: AC
  Administered 2020-02-15: 30 mg via INTRAVENOUS
  Filled 2020-02-15: qty 1

## 2020-02-15 MED ORDER — DICLOFENAC EPOLAMINE 1.3 % EX PTCH
1.0000 | MEDICATED_PATCH | Freq: Two times a day (BID) | CUTANEOUS | Status: DC
  Administered 2020-02-15: 1 via TRANSDERMAL
  Filled 2020-02-15: qty 1

## 2020-02-15 NOTE — ED Provider Notes (Signed)
[facility] DEPT Provider Note   CSN: [id number] Arrival date & time: 02/15/20  1108     History Chief Complaint  Patient presents with  . Neck Pain  . Headache    Craig [name] is a 54 y.o. male.  HPI    Patient presents with concern of neck pain, headache. Patient is awake, alert, giving his own history.  He notes that he has had headache, neck pain for some time, but in particular the past few days has had pain radiating from the occiput anteriorly, bilaterally.  There is associated neck soreness, described as severe, tight.  Patient has been seen and evaluated in urgent care, and with his chiropractor and with massage therapy.  He has been taking narcotics and muscle relaxants, all with no relief. He is unaware of any fever, denies any weakness in his extremities, denies vision changes, denies confusion. He notes that he has had history of back issues, including surgery in his lumbar region, but typically his cervical spine region is unremarkable.   Past Medical History:  Diagnosis Date  . Bipolar affective disorder ([place])   . Cocaine use    QUIT IN 2003  . GAD (generalized anxiety disorder)     Patient Active Problem List   Diagnosis Date Noted  . Excessive daytime sleepiness 10/25/2019  . Witnessed apneic spells 10/25/2019  . 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 Vasomotor rhinitis 10/25/2019  . TIA (transient ischemic attack) 10/18/2019  . Fatigue 10/23/2013  . Weakness 10/23/2013  . Chest pain 10/23/2013  . Chest congestion 10/23/2013    Past Surgical History:  Procedure Laterality Date  . BACK SURGERY    . KNEE SURGERY     x2  . LEFT BICEPS TEAR  05/2011   x2  . [place] SURGERY  1999  . right bicep tendon repair    . RIGHT HAND FRACTURE  2011       Family History  Problem Relation Age of Onset  . CAD Father   . Heart attack Mother   . Stroke Mother   . Lupus Mother   . Heart disease Maternal Grandfather   .  Heart failure Maternal Grandfather     Social History   Tobacco Use  . Smoking status: Former Research scientist (life sciences)  . Smokeless tobacco: Never Used  Substance Use Topics  . Alcohol use: Never    Comment: quit 2003   . Drug use: Yes    Types: Cocaine, Marijuana    Comment: quit cocaine 2003; smokes weed sometimes now    Home Medications Prior to Admission medications   Medication Sig Start Date End Date Taking? Authorizing Provider  ALPRAZolam Duanne Moron) 1 MG tablet Take 1 mg by mouth every 4 (four) hours.     [provider]  aspirin EC 81 MG tablet Take 1 tablet (81 mg total) by mouth daily. 10/25/13   Nahser, Wonda Cheng, MD  aspirin EC 81 MG tablet Take 81 mg by mouth daily as needed for moderate pain.    [provider]  atorvastatin (LIPITOR) 20 MG tablet Take 1 tablet (20 mg total) by mouth daily at 6 PM. Patient not taking: Reported on 02/04/2020 10/26/19   Melvenia Beam, MD  cyclobenzaprine (FLEXERIL) 5 MG tablet Take 5 mg by mouth daily as needed for muscle spasms.  05/11/18   [provider]  HYDROcodone-acetaminophen (NORCO) 7.5-325 MG tablet Take 1 tablet by mouth every 6 (six) hours as needed for moderate pain. 02/11/20  Raylene Everts, MD  ibuprofen (ADVIL) 800 MG tablet Take 1 tablet (800 mg total) by mouth 3 (three) times daily. 02/11/20   Raylene Everts, MD  Multiple Vitamin (MULTIVITAMIN) tablet Take 1 tablet by mouth daily.    [provider]  omeprazole (PRILOSEC) 20 MG capsule Take 20 mg by mouth daily as needed (indigestion).  08/31/19   [provider]  predniSONE (DELTASONE) 20 MG tablet Take 2 tablets (40 mg total) by mouth daily with breakfast. For the next four days 02/15/20   Carmin Muskrat, MD  sildenafil (VIAGRA) 100 MG tablet Take 100 mg by mouth daily as needed for erectile dysfunction. 09/24/19   [provider]  SPRAVATO, 84 MG DOSE, 28 MG/DEVICE SOPK Inhale 84 mg into the lungs 2 (two) times a week. Mon and Wed  09/24/19   [provider]  tiZANidine (ZANAFLEX) 4 MG tablet Take 1-2 tablets (4-8 mg total) by mouth every 6 (six) hours as needed for muscle spasms. 02/11/20   Raylene Everts, MD  zolpidem (AMBIEN) 10 MG tablet Take 10 mg by mouth at bedtime. 09/24/19   [provider]    Allergies    Celexa [citalopram hydrobromide], Effexor [venlafaxine], Lexapro [escitalopram oxalate], Paxil [paroxetine hcl], and Zoloft [sertraline hcl]  Review of Systems   Review of Systems  Constitutional:       Per HPI, otherwise negative  HENT:       Per HPI, otherwise negative  Respiratory:       Per HPI, otherwise negative  Cardiovascular:       Per HPI, otherwise negative  Gastrointestinal: Negative for vomiting.  Endocrine:       Negative aside from HPI  Genitourinary:       Neg aside from HPI   Musculoskeletal:       Per HPI, otherwise negative  Skin: Negative.   Allergic/Immunologic: Negative for immunocompromised state.  Neurological: Positive for headaches. Negative for syncope, speech difficulty and weakness.    Physical Exam Updated Vital Signs BP (!) 155/94   Pulse 99   Temp 99.9 F (37.7 C) (Oral)   Resp 19   SpO2 100%   Physical Exam Vitals and nursing note reviewed.  Constitutional:      General: He is not in acute distress.    Appearance: He is well-developed.  HENT:     Head: Normocephalic and atraumatic.  Eyes:     Conjunctiva/sclera: Conjunctivae normal.  Neck:     Thyroid: No thyromegaly.  Cardiovascular:     Rate and Rhythm: Normal rate and regular rhythm.  Pulmonary:     Effort: Pulmonary effort is normal. No respiratory distress.     Breath sounds: No stridor.  Abdominal:     General: There is no distension.  Musculoskeletal:     Cervical back: Torticollis present. Muscular tenderness present. No spinous process tenderness.  Skin:    General: Skin is warm and dry.  Neurological:     Mental Status: He is alert and oriented to person, place,  and time.     ED Results / Procedures / Treatments   Labs (all labs ordered are listed, but only abnormal results are displayed) Labs Reviewed  COMPREHENSIVE METABOLIC PANEL - Abnormal; Notable for the following components:      Result Value   Glucose, Bld 101 (*)    AST 80 (*)    ALT 103 (*)    All other components within normal limits  CBC WITH DIFFERENTIAL/PLATELET  CK  Procedures Procedures (including critical care time)  Medications Ordered in ED Medications  diclofenac (FLECTOR) 1.3 % 1 patch (1 patch Transdermal Patch Applied 02/15/20 1906)  sodium chloride 0.9 % bolus 1,000 mL (1,000 mLs Intravenous New Bag/Given 02/15/20 1811)  ketorolac (TORADOL) 30 MG/ML injection 30 mg (30 mg Intravenous Given 02/15/20 1812)  droperidol (INAPSINE) 2.5 MG/ML injection 2.5 mg (2.5 mg Intravenous Given 02/15/20 1813)    ED Course  I have reviewed the triage vital signs and the nursing notes.  Pertinent labs & imaging results that were available during my care of the patient were reviewed by me and considered in my medical decision making (see chart for details).   7:51 PM Patient awake, alert, substantially better, states that his headache and symptoms have improved markedly.  Absent other neuro complaints, and with his ability to move his neck freely, there is low suspicion for meningitis.  With unremarkable labs, including CK, low suspicion for myositis.  Some suspicion for cervical strain and headache.  No evidence for infection either. Given his substantial improvement here is discharged with ongoing therapy, outpatient follow-up.   Final Clinical Impression(s) / ED Diagnoses Final diagnoses:  Bad headache  Acute strain of neck muscle, initial encounter    Rx / DC Orders ED Discharge Orders         Ordered    predniSONE (DELTASONE) 20 MG tablet  Daily with breakfast     02/15/20 1947           Carmin Muskrat, MD 02/15/20 [id number]

## 2020-02-15 NOTE — ED Triage Notes (Signed)
Pt reports neck pain and headache x month. Seen UC and chiropractor for it. Reports pain just too bad as to visit for today.

## 2020-02-15 NOTE — Discharge Instructions (Signed)
As discussed, your evaluation today has been largely reassuring.  But, it is important that you monitor your condition carefully, and do not hesitate to return to the ED if you develop new, or concerning changes in your condition.  In addition to the prescribed medication, please obtain and use medicated topical patches including the ingredients methyl salicylate and lidocaine.  These are available at any local pharmacy.  Otherwise, please follow-up with your physician for appropriate ongoing care.

## 2020-04-17 ENCOUNTER — Ambulatory Visit: Payer: Medicare Other | Admitting: Adult Health

## 2020-04-17 ENCOUNTER — Telehealth: Payer: Self-pay

## 2020-04-17 NOTE — Telephone Encounter (Signed)
Atteplted to call Craig [name] is a 54 y.o. male re: documentation below. LM on the VM for the patient to call me back ---  [8:47 AM] [place], Craig [name] is being seen for TIA f/u from Jan. he was recently seen in May by megan for apnea f/u and was stable. if doing well, todays visit can be cancelled and f/u with megan in novemeber when he is scheduled

## 2020-04-20 ENCOUNTER — Other Ambulatory Visit: Payer: Self-pay | Admitting: Neurology

## 2020-04-28 IMAGING — CT CT ANGIO HEAD
1 of 10 series · 6 of 33 positions shown · IV contrast (OMNIPAQUE 350)
Comparison: None.

CLINICAL DATA: Altered mental status with headache

EXAM:
CT ANGIOGRAPHY HEAD AND NECK
TECHNIQUE: Multidetector CT imaging of the head and neck was performed using
the standard protocol during bolus administration of intravenous
contrast. Multiplanar CT image reconstructions and MIPs were
obtained to evaluate the vascular anatomy. Carotid stenosis
measurements (when applicable) are obtained utilizing NASCET
criteria, using the distal internal carotid diameter as the
denominator.
CONTRAST:  100mL OMNIPAQUE IOHEXOL 350 MG/ML SOLN

[Series 11: ax thin · axial · 0.34mm/px · z∈[+1410,+1664]mm · 6 of 363 slices shown]
[im 52/363  soft-tissue]
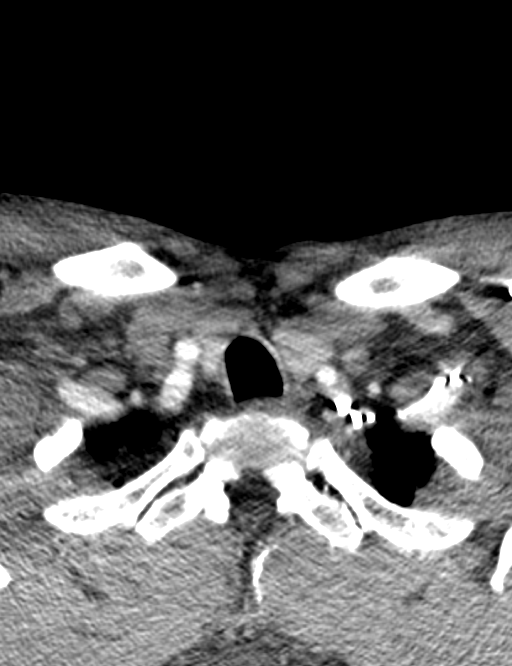
[im 104/363  bone]
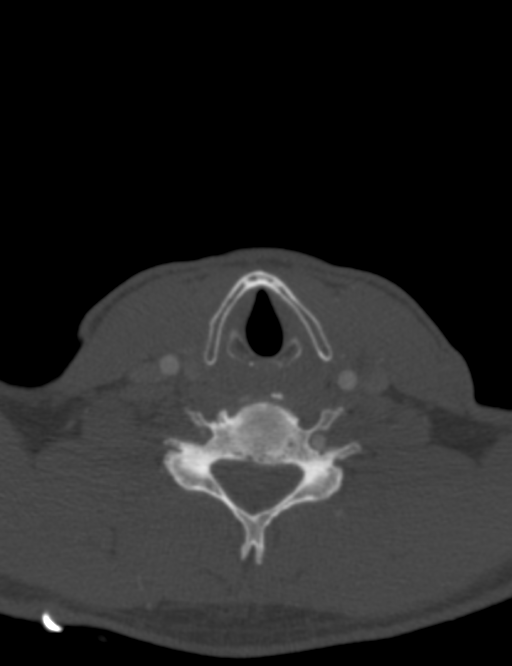
[im 156/363  soft-tissue]
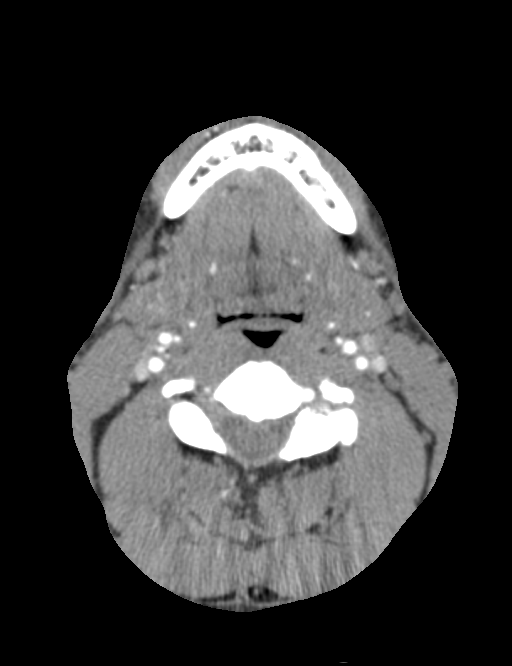
[im 207/363  bone]
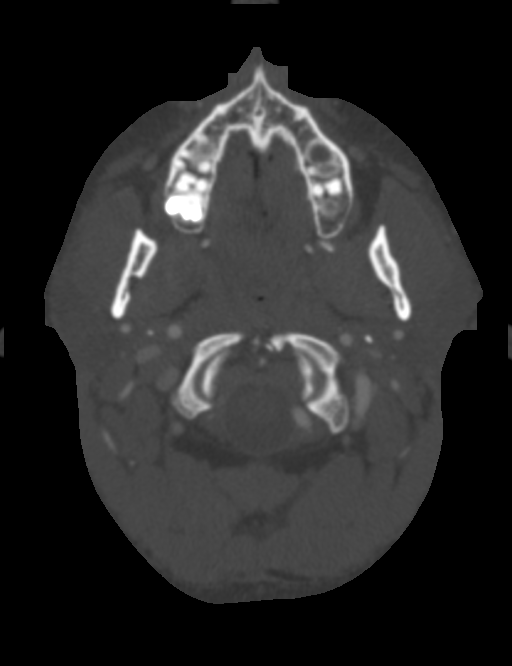
[im 259/363  soft-tissue]
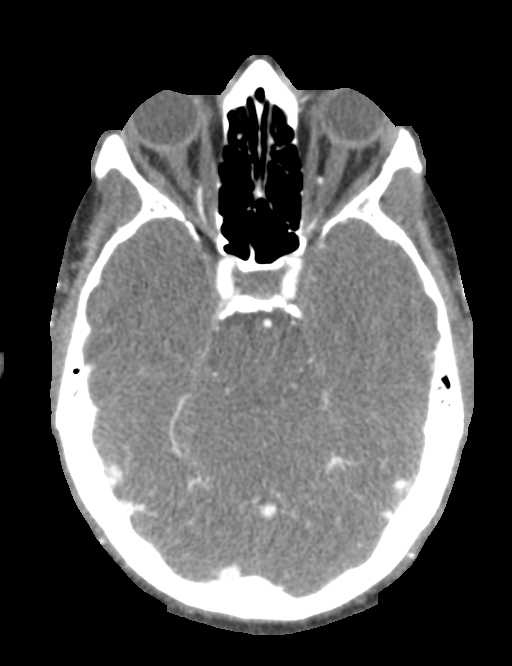
[im 311/363  bone]
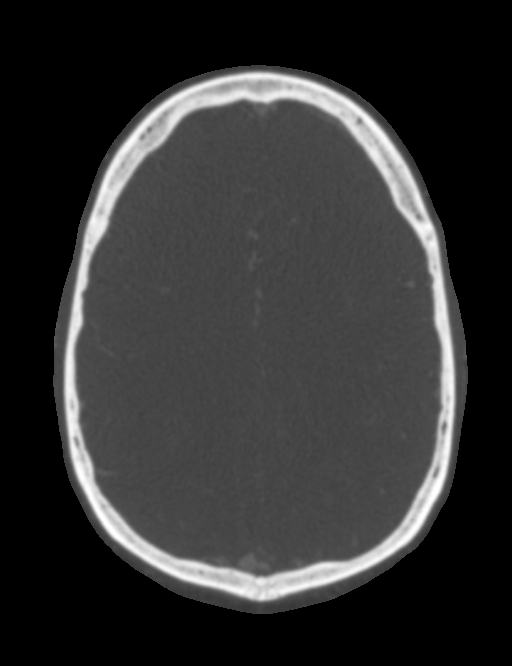

[6 of 33 positions shown; findings below may reference images not displayed]

FINDINGS: CT HEAD

Brain: There is no acute intracranial hemorrhage, mass effect, or
edema. Gray-white differentiation is preserved. Ventricles and sulci
are normal in size and configuration. There is no extra-axial
collection.

Vascular: No hyperdense vessel or unexpected calcification.

Skull: Unremarkable.

Sinuses: Aerated

Orbits: Unremarkable.

Review of the MIP images confirms the above findings

CTA NECK FINDINGS

Aortic arch: Great vessel origins are partially obscured by artifact
but appear patent.

Right carotid system: Patent.  There is no measurable stenosis.

Left carotid system: Patent.  There is no measurable stenosis.

Vertebral arteries: Patent. Left vertebral artery is dominant with
congenitally small right artery.

Skeleton: Degenerative changes of the cervical spine.

Other neck: No neck mass or adenopathy.

Upper chest: No apical lung mass.

Review of the MIP images confirms the above findings

CTA HEAD FINDINGS

Anterior circulation: Intracranial internal carotid arteries are
patent. Anterior and middle cerebral arteries are patent.

Posterior circulation: Right vertebral artery becomes even more
diminutive after origin of PICA. Intracranial left vertebral artery
is patent. Basilar artery is patent. Posterior cerebral arteries are
patent. A right posterior communicating artery is identified.

Venous sinuses: As permitted by contrast timing, patent.

Review of the MIP images confirms the above findings
IMPRESSION: No acute intracranial abnormality. No large vessel occlusion,
hemodynamically significant stenosis, or evidence of dissection

## 2020-08-04 ENCOUNTER — Other Ambulatory Visit: Payer: Self-pay | Admitting: Orthopedic Surgery

## 2020-08-04 DIAGNOSIS — M542 Cervicalgia: Secondary | ICD-10-CM

## 2020-08-06 ENCOUNTER — Ambulatory Visit: Payer: Medicare Other | Admitting: Adult Health

## 2020-08-11 ENCOUNTER — Other Ambulatory Visit: Payer: Self-pay | Admitting: Surgery

## 2020-08-11 DIAGNOSIS — D171 Benign lipomatous neoplasm of skin and subcutaneous tissue of trunk: Secondary | ICD-10-CM

## 2020-08-26 ENCOUNTER — Ambulatory Visit
Admission: RE | Admit: 2020-08-26 | Discharge: 2020-08-26 | Disposition: A | Discharge status: Home / Self Care | Payer: Medicare Other | Source: Ambulatory Visit | Attending: Surgery | Admitting: Surgery

## 2020-08-26 DIAGNOSIS — D171 Benign lipomatous neoplasm of skin and subcutaneous tissue of trunk: Secondary | ICD-10-CM

## 2020-08-27 ENCOUNTER — Other Ambulatory Visit: Payer: Managed Care, Other (non HMO)

## 2020-10-21 ENCOUNTER — Other Ambulatory Visit: Payer: Self-pay | Admitting: Orthopedic Surgery

## 2020-10-21 DIAGNOSIS — M542 Cervicalgia: Secondary | ICD-10-CM

## 2020-11-05 ENCOUNTER — Ambulatory Visit (HOSPITAL_COMMUNITY)
Admission: EM | Admit: 2020-11-05 | Discharge: 2020-11-05 | Disposition: A | Discharge status: Home / Self Care | Payer: Medicare Other | Attending: Family Medicine | Admitting: Family Medicine

## 2020-11-05 ENCOUNTER — Encounter (HOSPITAL_COMMUNITY): Payer: Self-pay | Admitting: Emergency Medicine

## 2020-11-05 ENCOUNTER — Other Ambulatory Visit: Payer: Self-pay

## 2020-11-05 DIAGNOSIS — N4889 Other specified disorders of penis: Secondary | ICD-10-CM | POA: Insufficient documentation

## 2020-11-05 MED ORDER — DOXYCYCLINE HYCLATE 100 MG PO CAPS
100.0000 mg | ORAL_CAPSULE | Freq: Two times a day (BID) | ORAL | 0 refills | Status: DC
Start: 2020-11-05 — End: 2021-12-23

## 2020-11-05 MED ORDER — CEFTRIAXONE SODIUM 500 MG IJ SOLR
500.0000 mg | Freq: Once | INTRAMUSCULAR | Status: AC
  Administered 2020-11-05: 500 mg via INTRAMUSCULAR

## 2020-11-05 MED ORDER — CEFTRIAXONE SODIUM 500 MG IJ SOLR
INTRAMUSCULAR | Status: AC
  Filled 2020-11-05: qty 500

## 2020-11-05 NOTE — ED Triage Notes (Signed)
Penile burning/tingling.  Symptoms started yesterday.  No penile discharge

## 2020-11-05 NOTE — ED Provider Notes (Signed)
[name]   [id number] 11/05/20 Arrival Time: [place]:  1. Penile irritation       Discharge Instructions     You have been given the following today for treatment of suspected gonorrhea and/or chlamydia:  cefTRIAXone (ROCEPHIN) injection 500 mg  Please pick up your prescription for doxycycline 100 mg and begin taking twice daily for the next seven (7) days.  Even though we have treated you today, we have sent testing for sexually transmitted infections. We will notify you of any positive results once they are received. If required, we will prescribe any medications you might need.  Please refrain from all sexual activity for at least the next seven days.     Pending: Labs Reviewed  CYTOLOGY, (ORAL, ANAL, URETHRAL) ANCILLARY ONLY    Will notify of any positive results. Instructed to refrain from sexual activity for at least seven days.  Reviewed expectations re: course of current medical issues. Questions answered. Outlined signs and symptoms indicating need for more acute intervention. Patient verbalized understanding. After Visit Summary given.   SUBJECTIVE:  Craig [name] is a 55 y.o. male who presents with complaint of penile irritation; reports unprotected sex with new male partner within past week; unsure if penile discharge is present. No specific aggravating or alleviating factors reported. Denies: urinary frequency and gross hematuria. Afebrile. No abdominal or pelvic pain. No n/v. No rashes or lesions.  OBJECTIVE:  Vitals:   11/05/20 1430  BP: (!) 151/76  Pulse: 88  Resp: 18  Temp: 98.3 F (36.8 C)  TempSrc: Oral  SpO2: 97%     General appearance: alert, cooperative, appears stated age and no distress Throat: lips, mucosa, and tongue normal; teeth and gums normal Lungs: unlabored respirations; speaks full sentences without difficulty Back: no CVA tenderness; FROM at waist Abdomen: soft, non-tender GU: normal  appearing genitalia Skin: warm and dry Psychological: alert and cooperative; normal mood and affect.    Labs Reviewed  CYTOLOGY, (ORAL, ANAL, URETHRAL) ANCILLARY ONLY    Allergies  Allergen Reactions  . Celexa [Citalopram Hydrobromide]   . Effexor [Venlafaxine] Other (See Comments)  . Lexapro [Escitalopram Oxalate]     AGITATED   . Paxil [Paroxetine Hcl]     AGITATED   . Zoloft [Sertraline Hcl]     AGITATED    Past Medical History:  Diagnosis Date  . Bipolar affective disorder ([place])   . Cocaine use    QUIT IN 2003  . GAD (generalized anxiety disorder)    Family History  Problem Relation Age of Onset  . CAD Father   . Heart attack Mother   . Stroke Mother   . Lupus Mother   . Heart disease Maternal Grandfather   . Heart failure Maternal Grandfather    Social History   Socioeconomic History  . Marital status: Married    Spouse name: Not on file  . Number of children: Not on file  . Years of education: Not on file  . Highest education level: Not on file  Occupational History  . Not on file  Tobacco Use  . Smoking status: Former Research scientist (life sciences)  . Smokeless tobacco: Never Used  Vaping Use  . Vaping Use: Never used  Substance and Sexual Activity  . Alcohol use: Never    Comment: quit 2003   . Drug use: Yes    Types: Cocaine, Marijuana    Comment: quit cocaine 2003; smokes weed sometimes now  . Sexual activity: Yes  Other Topics  Concern  . Not on file  Social History Narrative   Lives at home with wife Craig [name]    Right handed   Caffeine: sugar free Rip-it energy drink in the mornings   Social Determinants of Health   Financial Resource Strain: Not on file  Food Insecurity: Not on file  Transportation Needs: Not on file  Physical Activity: Not on file  Stress: Not on file  Social Connections: Not on file  Intimate Partner Violence: Not on file          Vanessa Kick, MD 11/05/20 1513

## 2020-11-05 NOTE — Discharge Instructions (Signed)

## 2020-11-06 ENCOUNTER — Ambulatory Visit (HOSPITAL_COMMUNITY): Payer: Self-pay

## 2020-11-06 LAB — CYTOLOGY, (ORAL, ANAL, URETHRAL) ANCILLARY ONLY
Chlamydia: NEGATIVE
Comment: NEGATIVE
Comment: NEGATIVE
Comment: NORMAL
Neisseria Gonorrhea: NEGATIVE
Trichomonas: NEGATIVE

## 2021-12-23 ENCOUNTER — Encounter (HOSPITAL_COMMUNITY): Payer: Self-pay

## 2021-12-23 ENCOUNTER — Ambulatory Visit (HOSPITAL_COMMUNITY)
Admission: EM | Admit: 2021-12-23 | Discharge: 2021-12-23 | Disposition: A | Discharge status: Home / Self Care | Payer: Managed Care, Other (non HMO) | Attending: Family Medicine | Admitting: Family Medicine

## 2021-12-23 DIAGNOSIS — A539 Syphilis, unspecified: Secondary | ICD-10-CM | POA: Diagnosis not present

## 2021-12-23 MED ORDER — PENICILLIN G BENZATHINE 1200000 UNIT/2ML IM SUSY
2.4000 10*6.[IU] | PREFILLED_SYRINGE | Freq: Once | INTRAMUSCULAR | Status: AC
  Administered 2021-12-23: 2.4 10*6.[IU] via INTRAMUSCULAR

## 2021-12-23 MED ORDER — PENICILLIN G BENZATHINE 1200000 UNIT/2ML IM SUSY
PREFILLED_SYRINGE | INTRAMUSCULAR | Status: AC
  Filled 2021-12-23: qty 4

## 2021-12-23 NOTE — Discharge Instructions (Signed)
Meds ordered this encounter  ?Medications  ? penicillin g benzathine (BICILLIN LA) 1200000 UNIT/2ML injection 2.4 Million Units  ?  Order Specific Question:   Antibiotic Indication:  ?  Answer:   Syphilis  ? ? ?

## 2021-12-23 NOTE — ED Triage Notes (Signed)
Pt states gave blood 3 wks ago and received a letter last week he is positive for RPR. Has his letter with in. Pt denies any sx's. ?

## 2021-12-23 NOTE — ED Provider Notes (Signed)
?  [place] ? ? ?[id number] ?12/23/21 Arrival Time: 3532 ? ?ASSESSMENT & PLAN: ? ?1. Syphilis   ? ?Positive test results with him today. Will treat. No symptoms. ?No previous tx for syphilis reported. ? ? ? ?Discharge Instructions   ? ?  ? ?Meds ordered this encounter  ?Medications  ? penicillin g benzathine (BICILLIN LA) 1200000 UNIT/2ML injection 2.4 Million Units  ?  Order Specific Question:   Antibiotic Indication:  ?  Answer:   Syphilis  ? ? ? ? ? ?Declines further testing. ? ?Reviewed expectations re: course of current medical issues. Questions answered. ?Outlined signs and symptoms indicating need for more acute intervention. ?Patient verbalized understanding. ?After Visit Summary given. ? ? ?SUBJECTIVE: ? ?Craig [name] is a 56 y.o. male who presents with a positive syphilis test after giving blood a few weeks ago. No symptoms. Desires treatment. No h/o treatment or symptoms. Otherwise well. Other STI/STD testing negative. ? ?OBJECTIVE: ? ?Vitals:  ? 12/23/21 1636 12/23/21 1639  ?BP:  138/80  ?Pulse: 82   ?Resp: 18   ?Temp: 98 ?F (36.7 ?C)   ?TempSrc: Oral   ?SpO2: 98%   ?  ? ?General appearance: alert, cooperative, appears stated age and no distress ?GU: deferred ?Skin: warm and dry ?Psychological: alert and cooperative; normal mood and affect. ? ? ?Allergies  ?Allergen Reactions  ? Celexa [Citalopram Hydrobromide]   ? Effexor [Venlafaxine] Other (See Comments)  ? Lexapro [Escitalopram Oxalate]   ?  AGITATED ?  ? Paxil [Paroxetine Hcl]   ?  AGITATED ?  ? Zoloft [Sertraline Hcl]   ?  AGITATED  ? ? ?Past Medical History:  ?Diagnosis Date  ? Bipolar affective disorder ([place])   ? Cocaine use   ? QUIT IN 2003  ? GAD (generalized anxiety disorder)   ? ?Family History  ?Problem Relation Age of Onset  ? CAD Father   ? Heart attack Mother   ? Stroke Mother   ? Lupus Mother   ? Heart disease Maternal Grandfather   ? Heart failure Maternal Grandfather   ? ?Social History  ? ?Socioeconomic History  ?  Marital status: Married  ?  Spouse name: Not on file  ? Number of children: Not on file  ? Years of education: Not on file  ? Highest education level: Not on file  ?Occupational History  ? Not on file  ?Tobacco Use  ? Smoking status: Former  ? Smokeless tobacco: Never  ?Vaping Use  ? Vaping Use: Never used  ?Substance and Sexual Activity  ? Alcohol use: Never  ?  Comment: quit 2003   ? Drug use: Yes  ?  Types: Cocaine, Marijuana  ?  Comment: quit cocaine 2003; smokes weed sometimes now  ? Sexual activity: Yes  ?Other Topics Concern  ? Not on file  ?Social History Narrative  ? Lives at home with wife Sharyn Lull   ? Right handed  ? Caffeine: sugar free Rip-it energy drink in the mornings  ? ?Social Determinants of Health  ? ?Financial Resource Strain: Not on file  ?Food Insecurity: Not on file  ?Transportation Needs: Not on file  ?Physical Activity: Not on file  ?Stress: Not on file  ?Social Connections: Not on file  ?Intimate Partner Violence: Not on file  ? ? ? ? ? ? ? ?  ?Vanessa Kick, MD ?12/23/21 1951 ? ?

## 2022-06-21 DIAGNOSIS — M67911 Unspecified disorder of synovium and tendon, right shoulder: Secondary | ICD-10-CM | POA: Diagnosis not present

## 2022-06-21 DIAGNOSIS — M67912 Unspecified disorder of synovium and tendon, left shoulder: Secondary | ICD-10-CM | POA: Diagnosis not present

## 2022-10-13 DIAGNOSIS — F3340 Major depressive disorder, recurrent, in remission, unspecified: Secondary | ICD-10-CM | POA: Diagnosis not present

## 2022-10-13 DIAGNOSIS — Z86010 Personal history of colonic polyps: Secondary | ICD-10-CM | POA: Diagnosis not present

## 2022-10-13 DIAGNOSIS — Z131 Encounter for screening for diabetes mellitus: Secondary | ICD-10-CM | POA: Diagnosis not present

## 2022-10-13 DIAGNOSIS — Z125 Encounter for screening for malignant neoplasm of prostate: Secondary | ICD-10-CM | POA: Diagnosis not present

## 2022-10-13 DIAGNOSIS — Z8673 Personal history of transient ischemic attack (TIA), and cerebral infarction without residual deficits: Secondary | ICD-10-CM | POA: Diagnosis not present

## 2022-10-13 DIAGNOSIS — Z0000 Encounter for general adult medical examination without abnormal findings: Secondary | ICD-10-CM | POA: Diagnosis not present

## 2022-10-13 DIAGNOSIS — Z13220 Encounter for screening for lipoid disorders: Secondary | ICD-10-CM | POA: Diagnosis not present

## 2022-10-13 DIAGNOSIS — F411 Generalized anxiety disorder: Secondary | ICD-10-CM | POA: Diagnosis not present

## 2022-10-13 DIAGNOSIS — Z8619 Personal history of other infectious and parasitic diseases: Secondary | ICD-10-CM | POA: Diagnosis not present

## 2022-10-13 DIAGNOSIS — J309 Allergic rhinitis, unspecified: Secondary | ICD-10-CM | POA: Diagnosis not present

## 2022-10-13 DIAGNOSIS — K219 Gastro-esophageal reflux disease without esophagitis: Secondary | ICD-10-CM | POA: Diagnosis not present

## 2022-11-30 DIAGNOSIS — D485 Neoplasm of uncertain behavior of skin: Secondary | ICD-10-CM | POA: Diagnosis not present

## 2022-11-30 DIAGNOSIS — D225 Melanocytic nevi of trunk: Secondary | ICD-10-CM | POA: Diagnosis not present

## 2022-11-30 DIAGNOSIS — L821 Other seborrheic keratosis: Secondary | ICD-10-CM | POA: Diagnosis not present

## 2022-11-30 DIAGNOSIS — B9689 Other specified bacterial agents as the cause of diseases classified elsewhere: Secondary | ICD-10-CM | POA: Diagnosis not present

## 2022-11-30 DIAGNOSIS — L02222 Furuncle of back [any part, except buttock]: Secondary | ICD-10-CM | POA: Diagnosis not present

## 2022-12-01 DIAGNOSIS — D2272 Melanocytic nevi of left lower limb, including hip: Secondary | ICD-10-CM | POA: Diagnosis not present

## 2023-01-26 DIAGNOSIS — M79669 Pain in unspecified lower leg: Secondary | ICD-10-CM | POA: Diagnosis not present

## 2023-02-02 DIAGNOSIS — M79669 Pain in unspecified lower leg: Secondary | ICD-10-CM | POA: Diagnosis not present

## 2023-02-16 DIAGNOSIS — M79669 Pain in unspecified lower leg: Secondary | ICD-10-CM | POA: Diagnosis not present

## 2023-03-02 DIAGNOSIS — M79669 Pain in unspecified lower leg: Secondary | ICD-10-CM | POA: Diagnosis not present

## 2023-03-09 DIAGNOSIS — M79669 Pain in unspecified lower leg: Secondary | ICD-10-CM | POA: Diagnosis not present

## 2023-03-14 DIAGNOSIS — M79662 Pain in left lower leg: Secondary | ICD-10-CM | POA: Diagnosis not present

## 2023-03-21 DIAGNOSIS — M79669 Pain in unspecified lower leg: Secondary | ICD-10-CM | POA: Diagnosis not present

## 2023-04-04 DIAGNOSIS — M79669 Pain in unspecified lower leg: Secondary | ICD-10-CM | POA: Diagnosis not present

## 2023-04-21 DIAGNOSIS — M79669 Pain in unspecified lower leg: Secondary | ICD-10-CM | POA: Diagnosis not present

## 2023-05-13 DIAGNOSIS — M79669 Pain in unspecified lower leg: Secondary | ICD-10-CM | POA: Diagnosis not present

## 2023-05-25 DIAGNOSIS — M79669 Pain in unspecified lower leg: Secondary | ICD-10-CM | POA: Diagnosis not present

## 2023-06-08 DIAGNOSIS — M79669 Pain in unspecified lower leg: Secondary | ICD-10-CM | POA: Diagnosis not present

## 2023-06-30 DIAGNOSIS — M79669 Pain in unspecified lower leg: Secondary | ICD-10-CM | POA: Diagnosis not present

## 2023-07-13 DIAGNOSIS — M79669 Pain in unspecified lower leg: Secondary | ICD-10-CM | POA: Diagnosis not present

## 2023-08-03 DIAGNOSIS — M79669 Pain in unspecified lower leg: Secondary | ICD-10-CM | POA: Diagnosis not present

## 2023-08-08 DIAGNOSIS — M79669 Pain in unspecified lower leg: Secondary | ICD-10-CM | POA: Diagnosis not present

## 2023-08-30 ENCOUNTER — Ambulatory Visit: Payer: PPO | Admitting: Family Medicine

## 2023-08-30 ENCOUNTER — Encounter: Payer: Self-pay | Admitting: Family Medicine

## 2023-08-30 ENCOUNTER — Ambulatory Visit
Admission: RE | Admit: 2023-08-30 | Discharge: 2023-08-30 | Disposition: A | Discharge status: Home / Self Care | Payer: PPO | Source: Ambulatory Visit | Attending: Family Medicine | Admitting: Family Medicine

## 2023-08-30 VITALS — BP 124/84 | Ht 70.25 in | Wt 190.0 lb

## 2023-08-30 DIAGNOSIS — M25512 Pain in left shoulder: Secondary | ICD-10-CM

## 2023-08-30 DIAGNOSIS — M25511 Pain in right shoulder: Secondary | ICD-10-CM
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
## 2023-09-05 ENCOUNTER — Other Ambulatory Visit: Payer: PPO | Admitting: Family Medicine

## 2023-09-06 ENCOUNTER — Encounter: Payer: Self-pay | Admitting: Family Medicine

## 2023-09-06 ENCOUNTER — Ambulatory Visit (INDEPENDENT_AMBULATORY_CARE_PROVIDER_SITE_OTHER): Payer: PPO | Admitting: Family Medicine

## 2023-09-06 ENCOUNTER — Other Ambulatory Visit: Payer: Self-pay

## 2023-09-06 VITALS — BP 132/74 | Ht 70.0 in | Wt 190.0 lb

## 2023-09-06 DIAGNOSIS — M19011 Primary osteoarthritis, right shoulder: Secondary | ICD-10-CM | POA: Insufficient documentation

## 2023-09-06 DIAGNOSIS — M75101 Unspecified rotator cuff tear or rupture of right shoulder, not specified as traumatic: Secondary | ICD-10-CM | POA: Insufficient documentation

## 2023-09-06 DIAGNOSIS — M19012 Primary osteoarthritis, left shoulder: Secondary | ICD-10-CM | POA: Diagnosis not present

## 2023-09-06 DIAGNOSIS — M25512 Pain in left shoulder: Secondary | ICD-10-CM

## 2023-09-06 DIAGNOSIS — M25511 Pain in right shoulder: Secondary | ICD-10-CM

## 2023-09-06 MED ORDER — METHYLPREDNISOLONE ACETATE 40 MG/ML IJ SUSP
40.0000 mg | Freq: Once | INTRAMUSCULAR | Status: AC
  Administered 2023-09-06: 40 mg via INTRA_ARTICULAR

## 2023-09-06 NOTE — Assessment & Plan Note (Signed)
See assessment and plan for rotator cuff syndrome

## 2023-09-06 NOTE — Progress Notes (Unsigned)
Craig [name] - 57 y.o. male MRN [id number]  Date of birth: 1966-05-10  PCP: Blair Heys, MD (Inactive)  Subjective:  No chief complaint on file.  Bilateral shoulder ultrasound  HPI: Past Medical, Surgical, Social, and Family History Reviewed & Updated per EMR.   Patient is a 57 y.o. male here for follow up on bilateral R shoulder pain Last seen on 08/30/2023 and scheduled to return for b/l shoulder ultrasound  He had Xrays since then. The patient has tried gabapentin and ibuprofen which has helped some but he continues to have pain after lifting weights. He took a week off but then after one workout had recurrence. There has been no swelling, numbness, weakness, fever, or chills.    Past Medical History:  Diagnosis Date   Bipolar affective disorder (HCC)    Cocaine use    QUIT IN 2003   GAD (generalized anxiety disorder)     Current Outpatient Medications on File Prior to Visit  Medication Sig Dispense Refill   ALPRAZolam (XANAX) 1 MG tablet Take 1 mg by mouth every 4 (four) hours.     gabapentin (NEURONTIN) 600 MG tablet TAKE 1/2 TO 1 TABLET BY MOUTH DAILY AT BEDTIME     ibuprofen (ADVIL) 800 MG tablet Take 1 tablet (800 mg total) by mouth 3 (three) times daily. 21 tablet 0   sildenafil (VIAGRA) 100 MG tablet Take 100 mg by mouth daily as needed for erectile dysfunction.     SPRAVATO, 84 MG DOSE, 28 MG/DEVICE SOPK Inhale 84 mg into the lungs 2 (two) times a week. Mon and Wed     zolpidem (AMBIEN) 10 MG tablet Take 10 mg by mouth at bedtime.     [DISCONTINUED] omeprazole (PRILOSEC) 20 MG capsule Take 20 mg by mouth daily as needed (indigestion).      Current Facility-Administered Medications on File Prior to Visit  Medication Dose Route Frequency Provider Last Rate Last Admin   sodium chloride flush (NS) 0.9 % injection 16 mL  16 mL Intracatheter PRN Anson Fret, MD   16 mL at 10/30/19 1130    Past Surgical History:  Procedure Laterality Date   BACK SURGERY      KNEE SURGERY     x2   LEFT BICEPS TEAR  05/2011   x2   LUMBAR DISC SURGERY  1999   right bicep tendon repair     RIGHT HAND FRACTURE  2011    Allergies  Allergen Reactions   Celexa [Citalopram Hydrobromide]    Effexor [Venlafaxine] Other (See Comments)   Lexapro [Escitalopram Oxalate]     AGITATED    Paxil [Paroxetine Hcl]     AGITATED    Zoloft [Sertraline Hcl]     AGITATED        Objective:  Physical Exam: VS: BP:132/74  HR: bpm  TEMP: ( )  RESP:   HT:5\' 10"  (177.8 cm)   WT:190 lb (86.2 kg)  BMI:27.26  Gen: NAD, speaks clearly, comfortable in exam room Respiratory: Normal respiratory effort on room air. No signs of distress Skin: No rashes, abrasions, or ecchymosis MSK: Inspection of the shoulders shows some prominence of the AC joints bilaterally Full ROM bilaterally Strength 5/5 in all directions Empty can positive on the right, negative on left Obrien's negative Neuro: NVID  IMAGING: Bilateral shoulder x-ray 08/30/2023 showing: Mild to moderate AC joint degenerative changes bilaterally, no other acute abnormalities  Limited Ultrasound of Left Shoulder: Date: 09/06/2023 Indication: Shoulder pain Findings: Biceps Tendon SAX and  LAX: visualized in bicipital groove w/out hypoechoic changes, pectoralis and subscapularis insertions in tact, IR/ER does not demonstrate popping of the tendon out of the groove Subscapularis tendon - viewed in SAX and LAX inserting into the inferior lesser tubercle of humerus. echogenics: no hypo/hyperechoic changes present AC joint - narrowed, w/ superior osteophyte formation, no giser sign Supraspinatus tendon - viewed in SAX and LAX, tendon in tact, insertion at superior facet of greater tubercle of the humerus w/ echogenics: hyperechoic changes within the tendon fibers suggesting previous trauma/injury and suggestive of tendonitis,  Infraspinatus and Teres minor tendons - viewed in LAX and SAX at the middle facet of the greater  tubercle of humerus w/ echogenics: no hypo/hyperechoic changes present  Summary: AC joint osteoarthritis  Ultrasound and interpretation by Dr. Webb Silversmith and Dr. Christella Hartigan   Limited Ultrasound of Right Shoulder: Date: 09/06/2023 Indication: Shoulder pain Findings: Biceps Tendon SAX and LAX: visualized in bicipital groove w/ trace hypoechoic changes, pectoralis and subscapularis insertions in tact, IR/ER does not demonstrate popping of the tendon out of the groove Subscapularis tendon - viewed in SAX and LAX inserting into the inferior lesser tubercle of humerus. echogenics: no hypo/hyperechoic changes present AC joint - narrowed, w/ superior osteophyte formation, small giser sign Supraspinatus tendon - viewed in SAX and LAX, tendon in tact, insertion at superior facet of greater tubercle of the humerus w/ echogenics: hypoechoic changes within the tendon confirmed in both views, dynamic view w/no impingement at the acromion  Infraspinatus and Teres minor tendons - viewed in LAX and SAX at the middle facet of the greater tubercle of humerus w/ echogenics: no hypo/hyperechoic changes present within the teres minor but trace changes present in the infraspinatous Posterior glenohumeral joint visualized w/out effusion but some hyperechoic changes suggestive of previous injury  Summary: AC joint OA with partial tearing of the distal supraspinatous and infraspinatous  Ultrasound and interpretation by Dr. Webb Silversmith and Dr. Christella Hartigan    Assessment & Plan:   Osteoarthritis of left AC (acromioclavicular) joint - Hx, exam, and [place] consistent w/ AC oa  - Injection below, we discussed exercises to perform once the inflammation subsides - Follow up in 4-6 weeks  US-guided [facility] Joint injection, left shoulder After discussion on risks/benefits/indications, informed verbal consent was obtained. A timeout was then performed. The patient was seated in examination room. The area overlying the [facility] joint of the shoulder was  prepped with alcohol then utilizing ultrasound guidance, patient's AC joint was injected using a 25G, 1.5" needle with 0.5:1.40mL lidocaine:depomedrol of injectate via an out of -plane, walk-down approach. Visualization of injectate flow was noted under ultrasound guidance. Patient tolerated the procedure well without immediate complications.   Osteoarthritis of right AC (acromioclavicular) joint See assessment and plan for rotator cuff syndrome  Rotator cuff syndrome of right shoulder - Pt's strength and ROM in tact but exam and US findings consistent w/ partial tear of the distal supraspinatous with deltoid bursitis and involvement of the infraspinatous.  - Injection as below. We discussed home exercises to do for rehab including ice or pain control  - We will follow up in 4-6 weeks.   Right Subacromial Injection Procedure: After informed written consent timeout was performed, patient was in seated position on exam table.  The right shoulder was prepped with alcohol swab x2. Ethyl chloride spray used for topical anesthetic. Utilizing the posterior approach and a 25g needle, the right subacromial bursa was injected with 4:1 lidocaine:depomedrol.  Following the injection a bandage was applied to the  area. Patient tolerated procedure well without immediate complications. The patient was counseled as to the expected post-injection course, including the possibility of worsening of pain with steroid flare. Instructed as to concerning symptoms and advised to contact the office if these should arise.      Rica Mote MD [facility] Sports Medicine Fellow  Addendum:  Patient seen  and examined in the office with fellow.   History, exam, plan of care were precepted with me.  Ultrasound images reviewed and precepted with me.  I was present and assisted with injection procedures.  Agree with findings as documented in fellow note with additions as noted above.  Darene Lamer, DO, CAQSM

## 2023-09-06 NOTE — Assessment & Plan Note (Addendum)
-   Pt's strength and ROM in tact but exam and US findings consistent w/ partial tear of the distal supraspinatous with deltoid bursitis and involvement of the infraspinatous.  - Injection as below. We discussed home exercises to do for rehab including ice or pain control  - We will follow up in 4-6 weeks.   Right Subacromial Injection Procedure: After informed written consent timeout was performed, patient was in seated position on exam table.  The right shoulder was prepped with alcohol swab x2. Ethyl chloride spray used for topical anesthetic. Utilizing the posterior approach and a 25g needle, the right subacromial bursa was injected with 4:1 lidocaine:depomedrol.  Following the injection a bandage was applied to the area. Patient tolerated procedure well without immediate complications. The patient was counseled as to the expected post-injection course, including the possibility of worsening of pain with steroid flare. Instructed as to concerning symptoms and advised to contact the office if these should arise.

## 2023-09-06 NOTE — Assessment & Plan Note (Addendum)
-   Hx, exam, and [place] consistent w/ AC oa  - Injection below, we discussed exercises to perform once the inflammation subsides - Follow up in 4-6 weeks  US-guided [facility] Joint injection, left shoulder After discussion on risks/benefits/indications, informed verbal consent was obtained. A timeout was then performed. The patient was seated in examination room. The area overlying the [facility] joint of the shoulder was prepped with alcohol then utilizing ultrasound guidance, patient's AC joint was injected using a 25G, 1.5" needle with 0.5:1.22mL lidocaine:depomedrol of injectate via an out of -plane, walk-down approach. Visualization of injectate flow was noted under ultrasound guidance. Patient tolerated the procedure well without immediate complications.

## 2023-09-06 NOTE — Patient Instructions (Addendum)
Today you received an injection with a corticosteroid. This injection is usually done in response to pain and inflammation. There is some "numbing medicine" (Lidocaine) in the shot, so the injected area may be numb and feel really good for the next couple of hours. The numbing medicine usually wears off in 2-3 hours, and then your pain level may be back to where it was before the injection until the steroid starts working.    You may actually experience a small (as in 10%) INCREASE in pressure sensation in the first 24 hours---that is common.  Things to watch out for that you should contact us or a health care provider urgently would include: 1. Unusual (as in more than 10%) increase in pain 2. New fever > 101.5 3. New swelling or redness of the injected area. 4. Streaking of red lines around the area injected.  Do not hesitate to call or reach out with any questions or concerns.   To summarize the findings today: On ultrasound we saw some arthritis of the left acromioclavicular joint.  He also had some mild tendinitis of the supraspinatus muscle insertion on the left. On the right side we also saw acromioclavicular osteoarthritis as well as some partial tearing of the distal supraspinatus and a little deltoid bursitis.

## 2023-10-17 DIAGNOSIS — Z125 Encounter for screening for malignant neoplasm of prostate: Secondary | ICD-10-CM | POA: Diagnosis not present

## 2023-10-17 DIAGNOSIS — E782 Mixed hyperlipidemia: Secondary | ICD-10-CM | POA: Diagnosis not present

## 2023-10-17 DIAGNOSIS — Z131 Encounter for screening for diabetes mellitus: Secondary | ICD-10-CM | POA: Diagnosis not present

## 2023-10-18 ENCOUNTER — Encounter: Payer: Self-pay | Admitting: Family Medicine

## 2023-10-18 ENCOUNTER — Ambulatory Visit (INDEPENDENT_AMBULATORY_CARE_PROVIDER_SITE_OTHER): Payer: PPO | Admitting: Family Medicine

## 2023-10-18 ENCOUNTER — Other Ambulatory Visit: Payer: Self-pay

## 2023-10-18 VITALS — BP 110/60 | Ht 70.0 in | Wt 197.0 lb

## 2023-10-18 DIAGNOSIS — M25511 Pain in right shoulder: Secondary | ICD-10-CM

## 2023-10-18 DIAGNOSIS — M75101 Unspecified rotator cuff tear or rupture of right shoulder, not specified as traumatic: Secondary | ICD-10-CM | POA: Diagnosis not present

## 2023-10-18 DIAGNOSIS — M25512 Pain in left shoulder: Secondary | ICD-10-CM | POA: Diagnosis not present

## 2023-10-18 NOTE — Assessment & Plan Note (Signed)
-   The patient reports being 90% better.  However, on ultrasound he does still have partial tearing of the distal insertion of the supraspinatus. - Using this information we discussed caution going forward with weight lifting, adjustments to ensure engagement of the rhomboids for scapular stability prior to doing any bench pressing as well as decreasing weight. - He is going to restart his home exercises and incorporate this as part of his daily routine. - She can continue Voltaren gel and start icing nightly. - We will tentatively follow-up in 6 weeks

## 2023-10-18 NOTE — Progress Notes (Signed)
Craig [name] - 58 y.o. male MRN [id number]  Date of birth: Jul 22, 1966  PCP: Blair Heys, MD (Inactive)  Subjective:  No chief complaint on file.  Right rotator cuff pain and AC joint pain bilaterally  HPI: Past Medical, Surgical, Social, and Family History Reviewed & Updated per EMR.   Patient is a 58 y.o. male here for follow up after [facility] joint injections and SA injection for right rotator cuff pain last seen on 09/06/2023. The patient has tried home exercises which have helped immensely when he was resting in December. The [facility] joint pain has completely resolved and his rotator cuff pain has improved by 90%.  He started weightlifting in in January and instructed he can use a but stopped doing the home exercises.  Past Medical History:  Diagnosis Date   Bipolar affective disorder (HCC)    Cocaine use    QUIT IN 2003   GAD (generalized anxiety disorder)     Current Outpatient Medications on File Prior to Visit  Medication Sig Dispense Refill   ALPRAZolam (XANAX) 1 MG tablet Take 1 mg by mouth every 4 (four) hours.     gabapentin (NEURONTIN) 600 MG tablet TAKE 1/2 TO 1 TABLET BY MOUTH DAILY AT BEDTIME     sildenafil (VIAGRA) 100 MG tablet Take 100 mg by mouth daily as needed for erectile dysfunction.     zolpidem (AMBIEN) 10 MG tablet Take 10 mg by mouth at bedtime.     [DISCONTINUED] omeprazole (PRILOSEC) 20 MG capsule Take 20 mg by mouth daily as needed (indigestion).      Current Facility-Administered Medications on File Prior to Visit  Medication Dose Route Frequency Provider Last Rate Last Admin   sodium chloride flush (NS) 0.9 % injection 16 mL  16 mL Intracatheter PRN Anson Fret, MD   16 mL at 10/30/19 1130    Past Surgical History:  Procedure Laterality Date   BACK SURGERY     KNEE SURGERY     x2   LEFT BICEPS TEAR  05/2011   x2   LUMBAR DISC SURGERY  1999   right bicep tendon repair     RIGHT HAND FRACTURE  2011    Allergies  Allergen Reactions    Celexa [Citalopram Hydrobromide]    Effexor [Venlafaxine] Other (See Comments)   Lexapro [Escitalopram Oxalate]     AGITATED    Paxil [Paroxetine Hcl]     AGITATED    Zoloft [Sertraline Hcl]     AGITATED        Objective:  Physical Exam: VS: BP:110/60  HR: bpm  TEMP: ( )  RESP:   HT:5\' 10"  (177.8 cm)   WT:197 lb (89.4 kg)  BMI:28.27  Gen: NAD, speaks clearly, comfortable in exam room Respiratory: Normal respiratory effort on room air. No signs of distress Skin: No rashes, abrasions, or ecchymosis MSK:  Shoulder: Inspection no abnormalities ROM: Full except for restricted external rotation at 35 degrees Strength: 5/5 in all directions AC joint: Nontender Special tests: Empty can test mildly positive O'Brien's negative  Limited Ultrasound of right shoulder:  Biceps Tendon SAX and LAX: visualized in bicipital groove w/ mild hypoechoic changes just medial to the biceps tendon but not surrounding it., pectoralis and subscapularis insertions in tact Supraspinatus tendon - viewed in SAX and LAX, tendon in tact, insertion at superior facet of greater tubercle of the humerus w/ a large osteophyte and echogenics: hypoechoic changes within the tendon confirmed in both views with disruption of the deep  fibers and an almost linear hypoechoic tear  Summary: Partial tearing of the distal supraspinatus with osteophyte formation at the insertion point  Ultrasound and interpretation by Dr. Webb Silversmith and Dr. Christella Hartigan    Assessment & Plan:   Rotator cuff syndrome of right shoulder - The patient reports being 90% better.  However, on ultrasound he does still have partial tearing of the distal insertion of the supraspinatus. - Using this information we discussed caution going forward with weight lifting, adjustments to ensure engagement of the rhomboids for scapular stability prior to doing any bench pressing as well as decreasing weight. - He is going to restart his home exercises and  incorporate this as part of his daily routine. - She can continue Voltaren gel and start icing nightly. - We will tentatively follow-up in 6 weeks     Rica Mote MD [facility] Sports Medicine Fellow

## 2023-12-15 ENCOUNTER — Other Ambulatory Visit: Payer: Self-pay

## 2023-12-15 ENCOUNTER — Encounter: Payer: Self-pay | Admitting: Family Medicine

## 2023-12-15 ENCOUNTER — Ambulatory Visit: Payer: PPO | Admitting: Family Medicine

## 2023-12-15 VITALS — BP 140/74 | Ht 70.0 in | Wt 206.0 lb

## 2023-12-15 DIAGNOSIS — M75121 Complete rotator cuff tear or rupture of right shoulder, not specified as traumatic: Secondary | ICD-10-CM

## 2023-12-15 DIAGNOSIS — M75111 Incomplete rotator cuff tear or rupture of right shoulder, not specified as traumatic: Secondary | ICD-10-CM

## 2023-12-15 MED ORDER — NITROGLYCERIN 0.2 MG/HR TD PT24: MEDICATED_PATCH | TRANSDERMAL | 1 refills | Status: AC

## 2023-12-15 NOTE — Progress Notes (Signed)
 DATE OF VISIT: 12/15/2023        Craig [name] DOB: 05/22/1966 MRN: [id number]  CC:  f/u Rt shoulder pain  History of present Illness: Craig [name] is a 58 y.o. male who presents for a follow-up visit for shoulder pain Last seen by me 10/18/23. - s/p AC joint injection and subacromial injection of the Rt shoulder 09/06/23 - at last visit AC joint about 90% improved - was recommended to focus on HEP and scapular stabilization after last visit - U/S at last visit showing partial tearing of supraspinatus tendon  Today he reports ongoing pain in the right shoulder Pain along the lateral shoulder, sometimes radiating down towards the elbow No neck pain Denies numbness/tingling Worse with overhead activity Does have associated nighttime pain, which he describes as the worst pain Not taking any regular medications for this, but has been using over-the-counter arthritis cream periodically which does not help  He does continue to exercise and lift weights.  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 He has gained about 10+ pounds for this competition Has not been lifting heavy weights, tries to lift within his means based on his pain  Medications:  Outpatient Encounter Medications as of 12/15/2023  Medication Sig   nitroGLYCERIN (NITRODUR - DOSED IN MG/24 HR) 0.2 mg/hr patch Use 1/4 patch daily to the affected area.   ALPRAZolam (XANAX) 1 MG tablet Take 1 mg by mouth every 4 (four) hours.   gabapentin (NEURONTIN) 600 MG tablet TAKE 1/2 TO 1 TABLET BY MOUTH DAILY AT BEDTIME   sildenafil (VIAGRA) 100 MG tablet Take 100 mg by mouth daily as needed for erectile dysfunction.   zolpidem (AMBIEN) 10 MG tablet Take 10 mg by mouth at bedtime.   [DISCONTINUED] omeprazole (PRILOSEC) 20 MG capsule Take 20 mg by mouth daily as needed (indigestion).    Facility-Administered Encounter Medications as of 12/15/2023  Medication   sodium chloride flush (NS) 0.9 % injection 16 mL     Allergies: is allergic to celexa [citalopram hydrobromide], effexor [venlafaxine], lexapro [escitalopram oxalate], paxil [paroxetine hcl], and zoloft [sertraline hcl].  Physical Examination: Vitals: BP (!) 140/74   Ht 5\' 10"  (1.778 m)   Wt 206 lb (93.4 kg)   BMI 29.56 kg/m  GENERAL:  Craig [name] is a 58 y.o. male appearing their stated age, alert and oriented x 3, in no apparent distress.  SKIN: no rashes or lesions, skin clean, dry, intact MSK: Shoulder: Right shoulder without any gross deformity.  Tender to palpation over the [facility] joint, greater tuberosity, bicipital groove.  Full range of motion with positive painful arc.  Positive empty can, positive Hawkins, positive Neer, mildly positive speeds.  Negative drop arm.  Rotator cuff strength 4/5 with resisted abduction and external rotation, internal rotation 5/5.  Normal grip strength. Left shoulder with full range of motion without pain or weakness Neurovascular intact distally  Radiology: Limited MSK ultrasound right shoulder Date: 12/15/2023 Indication: Follow-up right shoulder, assess for worsening of rotator cuff tear of the supraspinatus Findings: -Biceps tendon with hypoechoic change along the proximal aspect of the long head of the biceps.  No definitive tearing.  Small amount of fluid within the biceps tendon sheath.  No increased Doppler flow noted. -Normal pectoralis major attachment of the proximal humerus -Subscapularis with hypoechoic change, no definitive tearing -Supraspinatus with full-thickness tearing, does appear to be increasing in size compared to previous ultrasounds 10/18/2023 and 09/07/2023.  Also appears to have new or  near full-thickness tear of distal supraspinatus tendon near the insertional footplate.  Does have small amount of anechoic fluid along the superior aspect of the tendon as well.  No increased Doppler flow noted -Infraspinatus with hypoechoic changes, no definitive tearing -Normal-appearing  teres minor -AC joint with advanced degenerative changes, positive geyser sign, no increased Doppler flow. -Normal-appearing posterior glenohumeral joint  Impression: -Worsening tearing of the supraspinatus tendon with now appearance of full-thickness tear -Tendinopathy of infraspinatus and subscapularis -AC joint osteoarthritis  Images and interpretation completed by Darene Lamer, DO  Assessment & Plan Nontraumatic complete tear of right rotator cuff Worsening tearing of the supraspinatus, now with full-thickness tearing as seen on MSK ultrasound.  Plan: -MSK ultrasound completed as noted above.  Findings reviewed in detail.  Findings compared to previous ultrasounds in January December -Treatment options discussed.  Patient is not interested in surgical intervention.  He would be excellent candidate for trial of topical nitroglycerin therapy.  He has no history of migraine headaches, no adhesive allergies.  He does take Viagra as needed, he was advised he cannot take this while using the nitroglycerin patch and he is aware and okay with this. -Rx topical nitroglycerin patch 0.2 mg apply quarter patch to the right shoulder daily, change every 24 hours.  Explained how to take medication and common side effects.  Advised if he is experiencing headaches can use Tylenol or ibuprofen as needed.  If headache is severe should remove the patch and contact the office.  Advised that typical treatment duration is 3 months -He was provided with updated home exercise program today -Reviewed his gym/exercise/lifting regimen.  Should avoid overhead lifting and bench pressing.  Can proceed with other activities as long as not worsening his pain -Follow-up 6 weeks for reevaluation, plan follow-up MSK ultrasound at that time to assess for healing. -Did discuss possible other treatments including PRP and shockwave therapy.  He may consider PRP after his competition as he is aware there is a period where he would  have to rest from activity for this.   Patient expressed understanding & agreement with above.  Encounter Diagnosis  Name Primary?   Nontraumatic complete tear of right rotator cuff Yes    Orders Placed This Encounter  Procedures   [place] LIMITED JOINT SPACE STRUCTURES UP RIGHT

## 2023-12-15 NOTE — Patient Instructions (Signed)
 Nitroglycerin Protocol  Apply 1/4 nitroglycerin patch to affected area daily. Change position of patch within the affected area every 24 hours. You may experience a headache during the first 1-2 weeks of using the patch, these should subside. If you experience headaches after beginning nitroglycerin patch treatment, you may take your preferred over the counter pain reliever. Another side effect of the nitroglycerin patch is skin irritation or rash related to patch adhesive. Please notify our office if you develop more severe headaches or rash, and stop the patch. Tendon healing with nitroglycerin patch may require 12 to 24 weeks depending on the extent of injury. Men should not use if taking Viagra, Cialis, or Levitra. DO NOT USE VIAGRA while using the patch Do not use if you have migraines or rosacea.

## 2023-12-24 ENCOUNTER — Ambulatory Visit (INDEPENDENT_AMBULATORY_CARE_PROVIDER_SITE_OTHER): Payer: Self-pay

## 2023-12-24 ENCOUNTER — Ambulatory Visit (HOSPITAL_COMMUNITY)
Admission: EM | Admit: 2023-12-24 | Discharge: 2023-12-24 | Disposition: A | Discharge status: Home / Self Care | Payer: Self-pay | Attending: Emergency Medicine | Admitting: Emergency Medicine

## 2023-12-24 ENCOUNTER — Other Ambulatory Visit: Payer: Self-pay

## 2023-12-24 ENCOUNTER — Encounter (HOSPITAL_COMMUNITY): Payer: Self-pay | Admitting: Emergency Medicine

## 2023-12-24 DIAGNOSIS — S4992XA Unspecified injury of left shoulder and upper arm, initial encounter: Secondary | ICD-10-CM

## 2023-12-24 DIAGNOSIS — S5012XA Contusion of left forearm, initial encounter: Secondary | ICD-10-CM | POA: Diagnosis not present

## 2023-12-24 MED ORDER — IBUPROFEN 600 MG PO TABS: 600.0000 mg | ORAL_TABLET | Freq: Four times a day (QID) | ORAL | 0 refills | Status: AC | PRN

## 2023-12-24 MED ORDER — TIZANIDINE HCL 4 MG PO TABS: 4.0000 mg | ORAL_TABLET | Freq: Three times a day (TID) | ORAL | 0 refills | Status: AC | PRN
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
## 2023-12-24 NOTE — Discharge Instructions (Addendum)
 Take 600 mg of ibuprofen combined with 1000 mg Tylenol 3-4 times a day as needed for pain.  Zanaflex if you start to have muscle spasms.  Ice your forearm.    Radiology did not see a definite fracture of your forearm.  We have burned a disc of your images for you, however, the radiology report is pending.  You should be able to access the read through MyChart.  People tend to feel worse over the next several days, but most people are back to normal in 1 week. A small number of people will have persistent pain for up to six weeks.   Some people may require physical therapy. Early range of motion neck exercises has been shown to speed recovery. Start doing them as soon as possible. Start doing small range and amplitude movements of your neck, first in one direction, then the other. Repeat this 10 times in each direction every hour while awake. Do these to the maximum comfortable range. You may do this sitting up or lying down.  Go to www.goodrx.com  or www.costplusdrugs.com to look up your medications. This will give you a list of where you can find your prescriptions at the most affordable prices. Or ask the pharmacist what the cash price is, or if they have any other discount programs available to help make your medication more affordable. This can be less expensive than what you would pay with insurance.

## 2023-12-24 NOTE — ED Notes (Signed)
 Canceled ortho per dr Chaney Malling
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
## 2023-12-24 NOTE — ED Notes (Signed)
Called ortho tech 

## 2023-12-25 ENCOUNTER — Encounter: Payer: Self-pay | Admitting: Emergency Medicine

## 2024-01-26 ENCOUNTER — Ambulatory Visit: Admitting: Family Medicine

## 2024-02-29 DIAGNOSIS — Z860101 Personal history of adenomatous and serrated colon polyps: Secondary | ICD-10-CM | POA: Diagnosis not present

## 2024-02-29 DIAGNOSIS — Z09 Encounter for follow-up examination after completed treatment for conditions other than malignant neoplasm: Secondary | ICD-10-CM | POA: Diagnosis not present

## 2024-02-29 DIAGNOSIS — D122 Benign neoplasm of ascending colon: Secondary | ICD-10-CM | POA: Diagnosis not present

## 2024-02-29 DIAGNOSIS — K5730 Diverticulosis of large intestine without perforation or abscess without bleeding: Secondary | ICD-10-CM | POA: Diagnosis not present

## 2024-02-29 DIAGNOSIS — D120 Benign neoplasm of cecum: Secondary | ICD-10-CM | POA: Diagnosis not present

## 2024-03-05 DIAGNOSIS — D120 Benign neoplasm of cecum: Secondary | ICD-10-CM | POA: Diagnosis not present

## 2024-03-05 DIAGNOSIS — D122 Benign neoplasm of ascending colon: Secondary | ICD-10-CM | POA: Diagnosis not present

## 2024-03-14 DIAGNOSIS — D122 Benign neoplasm of ascending colon: Secondary | ICD-10-CM | POA: Diagnosis not present

## 2024-03-14 DIAGNOSIS — D120 Benign neoplasm of cecum: Secondary | ICD-10-CM | POA: Diagnosis not present

## 2024-03-14 DIAGNOSIS — Z09 Encounter for follow-up examination after completed treatment for conditions other than malignant neoplasm: Secondary | ICD-10-CM | POA: Diagnosis not present

## 2024-03-14 DIAGNOSIS — K5730 Diverticulosis of large intestine without perforation or abscess without bleeding: Secondary | ICD-10-CM | POA: Diagnosis not present

## 2024-03-14 DIAGNOSIS — Z860101 Personal history of adenomatous and serrated colon polyps: Secondary | ICD-10-CM | POA: Diagnosis not present

## 2024-04-14 ENCOUNTER — Encounter (HOSPITAL_COMMUNITY): Payer: Self-pay

## 2024-04-14 ENCOUNTER — Encounter (HOSPITAL_BASED_OUTPATIENT_CLINIC_OR_DEPARTMENT_OTHER): Payer: Self-pay

## 2024-04-14 ENCOUNTER — Emergency Department (HOSPITAL_BASED_OUTPATIENT_CLINIC_OR_DEPARTMENT_OTHER)
Admission: EM | Admit: 2024-04-14 | Discharge: 2024-04-14 | Disposition: A | Discharge status: Home / Self Care | Attending: Urology | Admitting: Urology

## 2024-04-14 ENCOUNTER — Emergency Department (HOSPITAL_BASED_OUTPATIENT_CLINIC_OR_DEPARTMENT_OTHER)

## 2024-04-14 ENCOUNTER — Other Ambulatory Visit: Payer: Self-pay

## 2024-04-14 ENCOUNTER — Ambulatory Visit (HOSPITAL_COMMUNITY)
Admission: EM | Admit: 2024-04-14 | Discharge: 2024-04-14 | Disposition: A | Discharge status: Home / Self Care | Attending: Family Medicine | Admitting: Family Medicine

## 2024-04-14 DIAGNOSIS — R519 Headache, unspecified: Secondary | ICD-10-CM | POA: Diagnosis not present

## 2024-04-14 DIAGNOSIS — R42 Dizziness and giddiness: Secondary | ICD-10-CM | POA: Diagnosis not present

## 2024-04-14 DIAGNOSIS — I6529 Occlusion and stenosis of unspecified carotid artery: Secondary | ICD-10-CM | POA: Diagnosis not present

## 2024-04-14 DIAGNOSIS — Z8669 Personal history of other diseases of the nervous system and sense organs: Secondary | ICD-10-CM | POA: Insufficient documentation

## 2024-04-14 DIAGNOSIS — H81399 Other peripheral vertigo, unspecified ear: Secondary | ICD-10-CM | POA: Insufficient documentation

## 2024-04-14 DIAGNOSIS — Z79899 Other long term (current) drug therapy: Secondary | ICD-10-CM | POA: Insufficient documentation

## 2024-04-14 DIAGNOSIS — I672 Cerebral atherosclerosis: Secondary | ICD-10-CM | POA: Diagnosis not present

## 2024-04-14 DIAGNOSIS — Z8673 Personal history of transient ischemic attack (TIA), and cerebral infarction without residual deficits: Secondary | ICD-10-CM | POA: Diagnosis not present

## 2024-04-14 HISTORY — DX: Transient cerebral ischemic attack, unspecified: G45.9

## 2024-04-14 LAB — COMPREHENSIVE METABOLIC PANEL WITH GFR
ALT: 41 U/L (ref 0–44)
AST: 39 U/L (ref 15–41)
Albumin: 4.4 g/dL (ref 3.5–5.0)
Alkaline Phosphatase: 78 U/L (ref 38–126)
Anion gap: 15 (ref 5–15)
BUN: 30 mg/dL — ABNORMAL HIGH (ref 6–20)
CO2: 23 mmol/L (ref 22–32)
Calcium: 9.4 mg/dL (ref 8.9–10.3)
Chloride: 103 mmol/L (ref 98–111)
Creatinine, Ser: 1.15 mg/dL (ref 0.61–1.24)
GFR, Estimated: >60 mL/min (ref >60)
Glucose, Bld: 108 mg/dL — ABNORMAL HIGH (ref 70–99)
Potassium: 4.3 mmol/L (ref 3.5–5.1)
Sodium: 140 mmol/L (ref 135–145)
Total Bilirubin: 0.9 mg/dL (ref 0.0–1.2)
Total Protein: 6.7 g/dL (ref 6.5–8.1)

## 2024-04-14 LAB — CBC
HCT: 49.8 % (ref 39.0–52.0)
Hemoglobin: 16.8 g/dL (ref 13.0–17.0)
MCH: 31.2 pg (ref 26.0–34.0)
MCHC: 33.7 g/dL (ref 30.0–36.0)
MCV: 92.4 fL (ref 80.0–100.0)
Platelets: 194 K/uL (ref 150–400)
RBC: 5.39 MIL/uL (ref 4.22–5.81)
RDW: 13.9 % (ref 11.5–15.5)
WBC: 5.3 K/uL (ref 4.0–10.5)
nRBC: 0 % (ref 0.0–0.2)

## 2024-04-14 LAB — DIFFERENTIAL
Abs Immature Granulocytes: 0 K/uL (ref 0.00–0.07)
Basophils Absolute: 0 K/uL (ref 0.0–0.1)
Basophils Relative: 1 %
Eosinophils Absolute: 0.1 K/uL (ref 0.0–0.5)
Eosinophils Relative: 2 %
Immature Granulocytes: 0 %
Lymphocytes Relative: 26 %
Lymphs Abs: 1.4 K/uL (ref 0.7–4.0)
Monocytes Absolute: 0.5 K/uL (ref 0.1–1.0)
Monocytes Relative: 9 %
Neutro Abs: 3.4 K/uL (ref 1.7–7.7)
Neutrophils Relative %: 62 %

## 2024-04-14 LAB — POCT FASTING CBG KUC MANUAL ENTRY: POCT Glucose (KUC): 107 mg/dL — AB (ref 70–99)

## 2024-04-14 LAB — ETHANOL: Alcohol, Ethyl (B): <15 mg/dL (ref <15)

## 2024-04-14 MED ORDER — IOHEXOL 350 MG/ML SOLN
75.0000 mL | Freq: Once | INTRAVENOUS | Status: AC | PRN
  Administered 2024-04-14: 75 mL via INTRAVENOUS

## 2024-04-14 MED ORDER — KETOROLAC TROMETHAMINE 30 MG/ML IJ SOLN
15.0000 mg | Freq: Once | INTRAMUSCULAR | Status: AC
  Administered 2024-04-14: 15 mg via INTRAVENOUS
  Filled 2024-04-14: qty 1

## 2024-04-14 MED ORDER — PROCHLORPERAZINE EDISYLATE 10 MG/2ML IJ SOLN
5.0000 mg | Freq: Once | INTRAMUSCULAR | Status: AC
  Administered 2024-04-14: 5 mg via INTRAVENOUS
  Filled 2024-04-14: qty 2

## 2024-04-14 NOTE — Discharge Instructions (Addendum)

## 2024-04-14 NOTE — ED Triage Notes (Addendum)
 Sent from urgent care for further eval of several dizzy spells with blurred vision, tinnitus, and slight headache. LKW 04/04/2024. PMH TIA 4-5 weeks ago. No blurred vision and tinnitus at this time.

## 2024-04-14 NOTE — ED Notes (Signed)
 Patient is being discharged from the Urgent Care and sent to the Emergency Department via private vehicle . Per Dr Rolinda, patient is in need of higher level of care due to dizziness and blurred vision. Patient is aware and verbalizes understanding of plan of care.  Vitals:   04/14/24 1103  BP: 134/78  Pulse: 76  Resp: 16  Temp: 97.7 F (36.5 C)  SpO2: 96%

## 2024-04-14 NOTE — ED Provider Notes (Signed)
 Received patient in signout from previous provider pending CTA. Hx of complicated migraine HA (dx by neuro in 2021), syphilis See her note.  In short, patient presents emergency department for evaluation of 1 episode of dizziness, blurry vision, tinnitus last week that resolved within minutes.  Yesterday, he reported that he woke up with blurry vision and mild frontal headache with intermittent tinnitus.  Physical exam notable for normal neuroexam.  No nystagmus.  No visual abnormalities.  Remains neuro intact while in ED.  Was provided Compazine  and Toradol  for headache with improvement.   Dr. Jerrie with neurology was consulted by previous EDP who recommended outpatient neurology follow-up as long as CTA is without significant abnormalities.  Has followed with [facility] in the past with can follow-up with them.  She is concerned regarding noncompliance with CPAP and raising ICP causing the symptoms.  We provided recommendations to use CPAP nightly as well as recommendation and paper form on DC paperwork.  Previous EDP discussed ED workup, disposition, return precautions discussed with patient who expressed understand agrees with plan.  All questions answered to his satisfaction.  He is very agreeable with discharge.  Discharge instructions and recommendations provided on DC paperwork.     Minnie Tinnie BRAVO, PA 04/14/24 TYRA    Lenor Hollering, MD 04/14/24 [phone]

## 2024-04-14 NOTE — ED Provider Notes (Signed)
 [place] [facility]GENCY DEPARTMENT AT [facility] Provider Note   CSN: [id number] Arrival date & time: 04/14/24  1342     Patient presents with: Dizziness   Craig [name] is a 58 y.o. male who presents emergency department with chief complaint of dizziness and vision abnormality.  Patient reports that last Thursday, July 17 he was coming out of [facility] when he had sudden onset of room spinning dizziness, ringing in his ears and disequilibrium.  Patient states that he was able to make it to his car and sat there for 5 minutes and then had resolution of his symptoms.  He denies any nausea or vomiting.  He was feeling fine until about 3 days ago when he woke up with blurry vision.  He states that his blurry vision was significant enough that he sprayed down his windshield and tried to roll his window down he states it did not help and he really said it was his eyes.  He states that the symptoms lasted for several hours and then completely resolved.  He did note ringing in his ears at that time as well. Patient had brief but recurrent symptoms again today with ear ringing which which he is having intermittently in both ears. He reports a history of TIA with previous inpatient workup. He has a history of sleep apnea and does not use a CPAP at this time. He states other than that he works out daily and does not have hypertension, hypercholesterolemia.  He does have a slight left-sided headache and also has a history of complicated migraines.  {Add pertinent medical, surgical, social history, OB history to HPI:32947}  Dizziness      Prior to Admission medications   Medication Sig Start Date End Date Taking? Authorizing Provider  ALPRAZolam (XANAX) 1 MG tablet Take 1 mg by mouth every 4 (four) hours.    [provider]  gabapentin (NEURONTIN) 600 MG tablet TAKE 1/2 TO 1 TABLET BY MOUTH DAILY AT BEDTIME 10/22/20   [provider]  ibuprofen  (ADVIL ) 600 MG tablet Take 1  tablet (600 mg total) by mouth every 6 (six) hours as needed. 12/24/23   Mortenson, Ashley, MD  nitroGLYCERIN  (NITRODUR - DOSED IN MG/24 HR) 0.2 mg/hr patch Use 1/4 patch daily to the affected area. Patient not taking: Reported on 04/14/2024 12/15/23   Teressa Rainell BROCKS, DO  sildenafil (VIAGRA) 100 MG tablet Take 100 mg by mouth daily as needed for erectile dysfunction. 09/24/19   [provider]  tiZANidine  (ZANAFLEX ) 4 MG tablet Take 1 tablet (4 mg total) by mouth every 8 (eight) hours as needed for muscle spasms. Patient not taking: Reported on 04/14/2024 12/24/23   Mortenson, Ashley, MD  zolpidem (AMBIEN) 10 MG tablet Take 10 mg by mouth at bedtime. 09/24/19   [provider]  omeprazole (PRILOSEC) 20 MG capsule Take 20 mg by mouth daily as needed (indigestion).  08/31/19 11/05/20  [provider]    Allergies: Celexa [citalopram hydrobromide], Effexor [venlafaxine], Lexapro [escitalopram oxalate], Paxil [paroxetine hcl], and Zoloft [sertraline hcl]    Review of Systems  Neurological:  Positive for dizziness.    Updated Vital Signs BP 122/76   Pulse 73   Temp 98.4 F (36.9 C)   Resp 16   Ht 5' 10 (1.778 m)   Wt 89.8 kg   SpO2 97%   BMI 28.41 kg/m   Physical Exam Physical Exam  Constitutional: Pt is oriented to person, place, and time. Pt appears well-developed and well-nourished.  No distress.  HENT:  Head: Normocephalic and atraumatic.  Mouth/Throat: Oropharynx is clear and moist.  Eyes: Conjunctivae and EOM are normal. Pupils are equal, round, and reactive to light. No scleral icterus.  No horizontal, vertical or rotational nystagmus  Neck: Normal range of motion. Neck supple.  Full active and passive ROM without pain No midline or paraspinal tenderness No nuchal rigidity or meningeal signs  Cardiovascular: Normal rate, regular rhythm and intact distal pulses.   Pulmonary/Chest: Effort normal and breath sounds normal. No respiratory distress. Pt has no  wheezes. No rales.  Abdominal: Soft. Bowel sounds are normal. There is no tenderness. There is no rebound and no guarding.  Musculoskeletal: Normal range of motion.  Lymphadenopathy:    No cervical adenopathy.  Neurological: Pt. is alert and oriented to person, place, and time. He has normal reflexes. No cranial nerve deficit.  Exhibits normal muscle tone. Coordination normal.  Mental Status:  Alert, oriented, thought content appropriate. Speech fluent without evidence of aphasia. Able to follow 2 step commands without difficulty.  Cranial Nerves:  II:  Peripheral visual fields grossly normal, pupils equal, round, reactive to light III,IV, VI: ptosis not present, extra-ocular motions intact bilaterally  V,VII: smile symmetric, facial light touch sensation equal VIII: hearing grossly normal bilaterally  IX,X: midline uvula rise  XI: bilateral shoulder shrug equal and strong XII: midline tongue extension  Motor:  5/5 in upper and lower extremities bilaterally including strong and equal grip strength and dorsiflexion/plantar flexion Sensory: Light touch normal in all extremities.  Deep Tendon Reflexes: 2+ and symmetric  Cerebellar: normal finger-to-nose with bilateral upper extremities  Skin: Skin is warm and dry. No rash noted. Pt is not diaphoretic.  Psychiatric: Pt has a normal mood and affect. Behavior is normal. Judgment and thought content normal.  Nursing note and vitals reviewed.  (all labs ordered are listed, but only abnormal results are displayed) Labs Reviewed  ETHANOL  CBC  DIFFERENTIAL  COMPREHENSIVE METABOLIC PANEL WITH GFR    EKG: None  Radiology: No results found.  {Document cardiac monitor, telemetry assessment procedure when appropriate:32947} Procedures   Medications Ordered in the ED - No data to display    {Click here for ABCD2, HEART and other calculators REFRESH Note before signing:1}                              Medical Decision Making Amount  and/or Complexity of Data Reviewed Labs: ordered. Radiology: ordered.  Risk Prescription drug management.   This patient presents to the ED for concern of  Vertigo, ear ringing, blurry , this involves an extensive number of treatment options, and is a complaint that carries with it a high risk of complications and morbidity.  The emergent differential diagnosis for acute vertigo low includes peripheral causes such as BPPV, barotrauma, ear foreign body, Mnire's disease, infectious causes such as lip bronchitis, vestibular neuritis or Ramsay Hunt syndrome.  Other emergent causes are central such as cerebellar stroke, vertebrobasilar insufficiency, neoplastic causes, vertebral artery dissection, MS, neurosyphilis or tuberculosis, epilepsy or migraine.  Other causes include anemia, hyperviscosity syndrome, alcohol or aminoglycoside use, renal failure, hypoglycemia and thyroid disease.   Co morbidities: .  has a past medical history of Bipolar affective disorder (HCC), Cocaine use, GAD (generalized anxiety disorder), and TIA (transient ischemic attack).   Social Determinants of Health:  .  SDOH Screenings   Social Connections: Unknown (02/01/2022)   Received from [facility]  Tobacco Use: Medium Risk (04/14/2024)    Additional history:  {Additional history obtained from emr, patien at bedside {External records from outside source obtained and reviewed including OP Neuro records and previous MRI  Lab Tests:  I Ordered, and personally interpreted labs.  The pertinent results include:   No acute fidings  Imaging Studies:  I ordered imaging studies including CT  angio head and neck  I independently visualized and interpreted imaging which showed no acute findings I agree with the radiologist interpretation  Cardiac Monitoring/ECG:  .The patient was maintained on a cardiac monitor.  I personally viewed and interpreted the cardiac monitored which showed an underlying rhythm of:   NSR rate of     Medicines ordered and prescription drug management:  I ordered medication including  Medications  prochlorperazine  (COMPAZINE ) injection 5 mg (5 mg Intravenous Given 04/14/24 1725)  ketorolac  (TORADOL ) 30 MG/ML injection 15 mg (15 mg Intravenous Given 04/14/24 1725)  iohexol  (OMNIPAQUE ) 350 MG/ML injection 75 mL (75 mLs Intravenous Contrast Given 04/14/24 1734)   for headache and vertigo Reevaluation of the patient after these medicines showed that the patient resolved I have reviewed the patients home medicines and have made adjustments as needed  Test Considered:  .Considered MRI, but sxs are not consistent with stroke or TIA  Critical Interventions:  .  Consultations Obtained: Case discussed with Dr. Jerrie who agrees that the patient does not need further MRI workup.  She also states that he should reconsider his CPAP use as this can use intracranial hypertension which could be contributing to his headaches and blurry vision.  Problem List / ED Course:  .   ICD-10-CM   1. History of blurry vision  Z86.69 Ambulatory referral to Neurology   BL eyes    2. Frontal headache  R51.9 Ambulatory referral to Neurology    3. Peripheral vertigo, unspecified laterality  H81.399 Ambulatory referral to Neurology      MDM: Patient here with blurry vision headache peripheral vertigo symptoms resolved after treatment in the emergency department negative CT angiogram and reassuring workup.  He will have the patient follow back up with establish care at neurology.  Symptoms are resolved.  Appropriate for discharge at this time    Dispostion:  After consideration of the diagnostic results and the patients response to treatment, I feel that the patent would benefit from discharge with neuro follow up and return.   {Document critical care time when appropriate  Document review of labs and clinical decision tools ie CHADS2VASC2, etc  Document your independent review of  radiology images and any outside records  Document your discussion with family members, caretakers and with consultants  Document social determinants of health affecting pt's care  Document your decision making why or why not admission, treatments were needed:32947:::1}   Final diagnoses:  None    ED Discharge Orders     None

## 2024-04-14 NOTE — ED Triage Notes (Addendum)
 Patient reports that he had dizzy spells 3 x this past week, blurred vision 3 days ago and states improved after a few hours and just a little blurry 2 days ago. Today, the patient states that he has a slight headache and doesn't feel right.   Patiaent reports a history of a TIA 4-5 years ago.

## 2024-04-14 NOTE — ED Notes (Signed)
 Patient transported to CT

## 2024-04-18 NOTE — ED Provider Notes (Signed)
 [facility]   [id number] 04/14/24 Arrival Time: 1047  ASSESSMENT & PLAN:  1. Episodic lightheadedness   2. History of blurry vision   3. Acute nonintractable headache, unspecified headache type    With history and current symptoms, recommend ED evaluation. To ED via POV; stable upon discharge.  Recommend:  Follow-up Information     Go to  [facility] Emergency Department at [facility].   Specialty: Emergency Medicine Contact information: [address] [place]  [address] [phone]                 Reviewed expectations re: course of current medical issues. Questions answered. Outlined signs and symptoms indicating need for more acute intervention. Patient verbalized understanding. After Visit Summary given.   SUBJECTIVE: History from: Patient Patient is able to give a clear and coherent history.  Craig [name] is a 58 y.o. male who reports that he's been having dizzy spells 3 x this past week, blurred vision 3 days ago and states improved after a few hours and just a little blurry 2 days ago. Today, the patient states that he has a slight headache and doesn't feel right.  H/O TIA. Denies extremity sensation changes or weakness.  Normal speech.  Past Medical History:  Diagnosis Date   Bipolar affective disorder (HCC)    Cocaine use    QUIT IN 2003   GAD (generalized anxiety disorder)    TIA (transient ischemic attack)    Denies head injury reported. Ambulatory without difficulty. No recent travel.  ROS: As per HPI. All other systems negative.    OBJECTIVE:  Vitals:   04/14/24 1103  BP: 134/78  Pulse: 76  Resp: 16  Temp: 97.7 F (36.5 C)  TempSrc: Oral  SpO2: 96%    General appearance: alert; NAD HENT: normocephalic; atraumatic Eyes: PERRLA; EOMI; conjunctivae normal Neck: supple with FROM Lungs: clear to auscultation bilaterally; unlabored respirations Heart: regular rate and  rhythm Extremities: no edema; symmetrical with no gross deformities Skin: warm and dry Neurologic: alert; speech is fluent and clear without dysarthria or aphasia; CN 2-12 grossly intact; no facial droop; normal gait; normal symmetric reflexes; normal extremity strength and sensation throughout; bilateral upper and lower extremity sensation is grossly intact with 5/5 symmetric strength; normal grip strength Psychological: alert and cooperative; normal mood and affect  Labs: Results for orders placed or performed during the hospital encounter of 04/14/24  POC CBG monitoring   Collection Time: 04/14/24 11:25 AM  Result Value Ref Range   POCT Glucose ([facility]) 107 (A) 70 - 99 mg/dL   Labs Reviewed  POCT FASTING CBG [facility] MANUAL ENTRY - Abnormal; Notable for the following components:      Result Value   POCT Glucose ([facility]) 107 (*)    All other components within normal limits    Imaging: No results found.  Allergies  Allergen Reactions   Celexa [Citalopram Hydrobromide]    Effexor [Venlafaxine] Other (See Comments)   Lexapro [Escitalopram Oxalate]     AGITATED    Paxil [Paroxetine Hcl]     AGITATED    Zoloft [Sertraline Hcl]     AGITATED    Past Medical History:  Diagnosis Date   Bipolar affective disorder (HCC)    Cocaine use    QUIT IN 2003   GAD (generalized anxiety disorder)    TIA (transient ischemic attack)    Social History   Socioeconomic History   Marital status: Significant Other    Spouse name:  Not on file   Number of children: Not on file   Years of education: Not on file   Highest education level: Not on file  Occupational History   Not on file  Tobacco Use   Smoking status: Former   Smokeless tobacco: Never  Vaping Use   Vaping status: Never Used  Substance and Sexual Activity   Alcohol use: Never    Comment: quit 2003    Drug use: Not Currently    Types: Cocaine, Marijuana    Comment: quit cocaine 2003; smokes weed sometimes now   Sexual activity:  Yes  Other Topics Concern   Not on file  Social History Narrative   Lives at home with wife Rosaline    Right handed   Caffeine: sugar free Rip-it energy drink in the mornings   Social Drivers of Health   Financial Resource Strain: Not on file  Food Insecurity: Not on file  Transportation Needs: Not on file  Physical Activity: Not on file  Stress: Not on file  Social Connections: Unknown (02/01/2022)   Received [facility]   Social Network    Social Network: Not on file  Intimate Partner Violence: Unknown (12/24/2021)   Received from [facility]   HITS    Physically Hurt: Not on file    Insult or Talk Down To: Not on file    Threaten Physical Harm: Not on file    Scream or Curse: Not on file   Family History  Problem Relation Age of Onset   CAD Father    Heart attack Mother    Stroke Mother    Lupus Mother    Heart disease Maternal Grandfather    Heart failure Maternal Grandfather    Past Surgical History:  Procedure Laterality Date   BACK SURGERY     KNEE SURGERY     x2   LEFT BICEPS TEAR  05/2011   x2   LUMBAR DISC SURGERY  1999   right bicep tendon repair     RIGHT HAND FRACTURE  2011      Rolinda Rogue, MD 04/18/24 1338
